# Patient Record
Sex: Male | Born: 1995 | Race: White | Hispanic: No | Marital: Single | Smoking: Never smoker
Health system: Southern US, Community
[De-identification: ages and names within clinical notes are randomized; demographics above are authoritative.]

## PROBLEM LIST (undated history)

## (undated) ENCOUNTER — Ambulatory Visit: Payer: Self-pay

## (undated) DIAGNOSIS — F419 Anxiety disorder, unspecified: Secondary | ICD-10-CM

## (undated) DIAGNOSIS — N289 Disorder of kidney and ureter, unspecified: Secondary | ICD-10-CM

## (undated) HISTORY — DX: Disorder of kidney and ureter, unspecified: N28.9

## (undated) HISTORY — PX: TONSILLECTOMY: SUR1361

---
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
## 2018-04-30 ENCOUNTER — Emergency Department: Payer: No Typology Code available for payment source

## 2018-04-30 ENCOUNTER — Emergency Department
Admission: EM | Admit: 2018-04-30 | Discharge: 2018-04-30 | Disposition: A | Discharge status: Home / Self Care | Payer: No Typology Code available for payment source | Attending: Emergency Medicine | Admitting: Emergency Medicine

## 2018-04-30 ENCOUNTER — Encounter: Payer: Self-pay | Admitting: Emergency Medicine

## 2018-04-30 ENCOUNTER — Other Ambulatory Visit: Payer: Self-pay

## 2018-04-30 DIAGNOSIS — Y9389 Activity, other specified: Secondary | ICD-10-CM | POA: Insufficient documentation

## 2018-04-30 DIAGNOSIS — S161XXA Strain of muscle, fascia and tendon at neck level, initial encounter: Secondary | ICD-10-CM | POA: Insufficient documentation

## 2018-04-30 DIAGNOSIS — Y990 Civilian activity done for income or pay: Secondary | ICD-10-CM | POA: Insufficient documentation

## 2018-04-30 DIAGNOSIS — Y929 Unspecified place or not applicable: Secondary | ICD-10-CM | POA: Insufficient documentation

## 2018-04-30 DIAGNOSIS — S0990XA Unspecified injury of head, initial encounter: Secondary | ICD-10-CM | POA: Diagnosis present
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
## 2018-04-30 NOTE — Discharge Instructions (Addendum)
Follow-up with your doctor Worker's Comp. choice if not better in 3 days.  Take Tylenol and ibuprofen as needed for pain.  He is what he across the upper back followed by ice.  If you are worsening return to the emergency department.

## 2018-04-30 NOTE — ED Notes (Signed)
Transported to CT and XR
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
## 2018-04-30 NOTE — ED Notes (Signed)
Pt verbalizes understanding of d/c instructions and follow up. 

## 2018-06-21 ENCOUNTER — Other Ambulatory Visit: Payer: Self-pay

## 2018-06-21 ENCOUNTER — Encounter: Payer: Self-pay | Admitting: Emergency Medicine

## 2018-06-21 ENCOUNTER — Ambulatory Visit
Admission: EM | Admit: 2018-06-21 | Discharge: 2018-06-21 | Disposition: A | Discharge status: Home / Self Care | Payer: Medicaid Other | Attending: Family Medicine | Admitting: Family Medicine

## 2018-06-21 DIAGNOSIS — H6123 Impacted cerumen, bilateral: Secondary | ICD-10-CM

## 2018-06-21 MED ORDER — NEOMYCIN-POLYMYXIN-HC 3.5-10000-1 OT SUSP: 4.0000 [drp] | Freq: Three times a day (TID) | OTIC | 0 refills | Status: AC

## 2018-06-21 NOTE — ED Provider Notes (Signed)
[facility] ____________________________________________  Time seen: Approximately 1:51 PM  I have reviewed the triage vital signs and the nursing notes.   HISTORY  Chief Complaint Ear Problem (left)  HPI Timothy James is a 22 y.o. male presented for evaluation of left ear.  Patient reports that today he had some light-colored waxy drainage from his left ear and also reports for the last few days has had some muffled hearing to his left ear.  Denies any ear pain.  States that he does have a issue of wax buildup in both of his ears.  Reports some recent cough, and states that his girlfriend has had a cold.  Denies sore throat or a lot of nasal congestion.  No fevers.  Denies ear trauma.  Denies other aggravating alleviating factors.  Reports otherwise feels well.   History reviewed. No pertinent past medical history. Denies   There are no active problems to display for this patient.   Past Surgical History:  Procedure Laterality Date  . TONSILLECTOMY       No current facility-administered medications for this encounter.   Current Outpatient Medications:  .  neomycin-polymyxin-hydrocortisone (CORTISPORIN) 3.5-10000-1 OTIC suspension, Place 4 drops into both ears 3 (three) times daily for 7 days., Disp: 5 mL, Rfl: 0  Allergies Patient has no known allergies.  Family History  Problem Relation Age of Onset  . Hypertension Mother   . Diabetes Father   . Other Father 27       traumatic brain injury    Social History Social History   Tobacco Use  . Smoking status: Never Smoker  . Smokeless tobacco: Never Used  Substance Use Topics  . Alcohol use: Not Currently  . Drug use: Not Currently    Review of Systems Constitutional: No fever ENT: No sore throat. As above. Cardiovascular: Denies chest pain. Respiratory: Denies shortness of breath. Gastrointestinal: No abdominal pain. Skin: Negative for  rash. .  ____________________________________________   PHYSICAL EXAM:  VITAL SIGNS: ED Triage Vitals [06/21/18 1156]  Enc Vitals Group     BP (!) 112/100     Pulse Rate 61     Resp 16     Temp 98.1 F (36.7 C)     Temp Source Oral     SpO2 100 %     Weight 150 lb (68 kg)     Height 6' (1.829 m)     Head Circumference      Peak Flow      Pain Score 0     Pain Loc      Pain Edu?      Excl. in GC?    Vitals:   06/21/18 1156 06/21/18 1158  BP: (!) 112/100 127/89  Pulse: 61   Resp: 16   Temp: 98.1 F (36.7 C)   TempSrc: Oral   SpO2: 100%   Weight: 150 lb (68 kg)   Height: 6' (1.829 m)      Constitutional: Alert and oriented. Well appearing and in no acute distress. Eyes: Conjunctivae are normal.  Head: Atraumatic. No sinus tenderness to palpation. No swelling. No erythema.  Ears: Bilateral ears nontender but with total cerumen impaction bilaterally.  Post irrigation and cerumen removal, canal is clear.  Left: Nontender, mild erythema and excoriation in canal, no TM erythema and normal-appearing TM.  Right: Nontender, minimal erythema to canal, normal-appearing TM.  No mastoid tenderness bilaterally.  Nose:No nasal congestion  Mouth/Throat: Mucous membranes are moist. No pharyngeal erythema. No tonsillar swelling  or exudate.  Neck: No stridor.  No cervical spine tenderness to palpation. Hematological/Lymphatic/Immunilogical: No cervical lymphadenopathy. Cardiovascular: Normal rate, regular rhythm. Grossly normal heart sounds.  Good peripheral circulation. Respiratory: Normal respiratory effort.  No retractions. No wheezes, rales or rhonchi. Good air movement.  Musculoskeletal: Ambulatory with steady gait.  Neurologic:  Normal speech and language. No gait instability. Skin:  Skin appears warm, dry and intact. No rash noted. Psychiatric: Mood and affect are normal. Speech and behavior are normal.  ___________________________________________   LABS (all labs ordered  are listed, but only abnormal results are displayed)  Labs Reviewed - No data to display ____________________________________________   PROCEDURES Procedures   Total cerumen impaction bilaterally.  Cerumen removal by nurse with irrigation.  Canals clear post.  Patient tolerated well.  INITIAL IMPRESSION / ASSESSMENT AND PLAN / ED COURSE  Pertinent labs & imaging results that were available during my care of the patient were reviewed by me and considered in my medical decision making (see chart for details).  Well-appearing patient.  Cerumen impaction.  Post removal reevaluated.  Patient reports improved hearing and denies pain.  Left ear canal with mild erythema excoriation.  Will supportively treat with Cortisporin.  Encourage supportive care.Discussed indication, risks and benefits of medications with patient. Discussed follow up and return parameters including no resolution or any worsening concerns. Patient verbalized understanding and agreed to plan.   ____________________________________________   FINAL CLINICAL IMPRESSION(S) / ED DIAGNOSES  Final diagnoses:  Bilateral impacted cerumen     ED Discharge Orders         Ordered    neomycin-polymyxin-hydrocortisone (CORTISPORIN) 3.5-10000-1 OTIC suspension  3 times daily     06/21/18 1258           Note: This dictation was prepared with Dragon dictation along with smaller phrase technology. Any transcriptional errors that result from this process are unintentional.         Renford Dills, NP 06/21/18 1357

## 2018-06-21 NOTE — ED Triage Notes (Signed)
Patient in today stating that he had some clear drainage from his left ear this morning. Patient denies pain or fever.

## 2018-06-21 NOTE — Discharge Instructions (Addendum)
Use medication as prescribed.  ° °Follow up with your primary care physician this week as needed. Return to Urgent care for new or worsening concerns.  ° °

## 2019-03-10 ENCOUNTER — Emergency Department: Payer: Self-pay

## 2019-03-10 ENCOUNTER — Encounter: Payer: Self-pay | Admitting: Emergency Medicine

## 2019-03-10 ENCOUNTER — Other Ambulatory Visit: Payer: Self-pay

## 2019-03-10 ENCOUNTER — Emergency Department
Admission: EM | Admit: 2019-03-10 | Discharge: 2019-03-10 | Disposition: A | Discharge status: Home / Self Care | Payer: Self-pay | Attending: Emergency Medicine | Admitting: Emergency Medicine

## 2019-03-10 DIAGNOSIS — R509 Fever, unspecified: Secondary | ICD-10-CM

## 2019-03-10 DIAGNOSIS — R5383 Other fatigue: Secondary | ICD-10-CM

## 2019-03-10 DIAGNOSIS — J069 Acute upper respiratory infection, unspecified: Secondary | ICD-10-CM | POA: Insufficient documentation

## 2019-03-10 DIAGNOSIS — R059 Cough, unspecified: Secondary | ICD-10-CM

## 2019-03-10 DIAGNOSIS — R0600 Dyspnea, unspecified: Secondary | ICD-10-CM

## 2019-03-10 DIAGNOSIS — R05 Cough: Secondary | ICD-10-CM

## 2019-03-10 DIAGNOSIS — Z20828 Contact with and (suspected) exposure to other viral communicable diseases: Secondary | ICD-10-CM | POA: Insufficient documentation

## 2019-03-10 LAB — SARS CORONAVIRUS 2 BY RT PCR (HOSPITAL ORDER, PERFORMED IN ~~LOC~~ HOSPITAL LAB): SARS Coronavirus 2: NEGATIVE

## 2019-03-10 MED ORDER — METHYLPREDNISOLONE 4 MG PO TBPK: ORAL_TABLET | ORAL | 0 refills | Status: DC

## 2019-03-10 MED ORDER — BENZONATATE 100 MG PO CAPS: 200.0000 mg | ORAL_CAPSULE | Freq: Three times a day (TID) | ORAL | 0 refills | Status: DC | PRN

## 2019-03-10 NOTE — ED Provider Notes (Signed)
[facility] Emergency Department Provider Note   ____________________________________________   First MD Initiated Contact with Patient 03/10/19 1840     (approximate)  I have reviewed the triage vital signs and the nursing notes.   HISTORY  Chief Complaint Cough and Fever    HPI Timothy James is a 23 y.o. male patient presents with fever, cough, headache, mild dyspnea, and fatigue for 2 days.  Patient state cough and shortness of breath increased this morning upon awakening.  Patient denies nausea, vomiting, or diarrhea.  Patient denies chest pain or myalgia.  Denies recent travel or known contact with  Asymptomatic or symptomatic COVID personnel.  Patient rates his headache as a 8/10.  Patient describes the headache as "throbbing".  No palliative measures for complaint.        History reviewed. No pertinent past medical history.  There are no active problems to display for this patient.   Past Surgical History:  Procedure Laterality Date  . TONSILLECTOMY      Prior to Admission medications   Medication Sig Start Date End Date Taking? Authorizing Provider  benzonatate (TESSALON PERLES) 100 MG capsule Take 2 capsules (200 mg total) by mouth 3 (three) times daily as needed. 03/10/19 03/09/20  Joni ReiningSmith, Dontray Haberland K, PA-C  methylPREDNISolone (MEDROL DOSEPAK) 4 MG TBPK tablet Take Tapered dose as directed 03/10/19   Joni ReiningSmith, Taheem Fricke K, PA-C    Allergies Patient has no known allergies.  Family History  Problem Relation Age of Onset  . Hypertension Mother   . Diabetes Father   . Other Father 2041       traumatic brain injury    Social History Social History   Tobacco Use  . Smoking status: Never Smoker  . Smokeless tobacco: Never Used  Substance Use Topics  . Alcohol use: Not Currently  . Drug use: Not Currently    Review of Systems Constitutional: Fever/chills.   Eyes: No visual changes. ENT: No sore throat. Cardiovascular: Denies chest  pain. Respiratory: Denies shortness of breath. Gastrointestinal: No abdominal pain.  No nausea, no vomiting.  No diarrhea.  No constipation. Genitourinary: Negative for dysuria. Musculoskeletal: Negative for back pain. Skin: Negative for rash. Neurological: Negative for headaches, focal weakness or numbness.  ____________________________________________   PHYSICAL EXAM:  VITAL SIGNS: ED Triage Vitals  Enc Vitals Group     BP 03/10/19 1833 119/81     Pulse Rate 03/10/19 1833 (!) 105     Resp 03/10/19 1833 18     Temp 03/10/19 1833 99.1 F (37.3 C)     Temp src --      SpO2 03/10/19 1833 98 %     Weight --      Height --      Head Circumference --      Peak Flow --      Pain Score 03/10/19 1827 8     Pain Loc --      Pain Edu? --      Excl. in GC? --    Constitutional: Alert and oriented. Well appearing and in no acute distress. Eyes: Conjunctivae are normal. PERRL. EOMI. Head: Atraumatic. Nose: No congestion/rhinnorhea. Mouth/Throat: Mucous membranes are moist.  Oropharynx non-erythematous. Neck: No stridor.  No cervical spine tenderness to palpation Hematological/Lymphatic/Immunilogical: No cervical lymphadenopathy. Cardiovascular: Normal rate, regular rhythm. Grossly normal heart sounds.  Good peripheral circulation. Respiratory: Normal respiratory effort.  No retractions. Lungs CTAB. Gastrointestinal: Soft and nontender. No distention. No abdominal bruits. No CVA tenderness. Genitourinary:  Deferred Musculoskeletal: No lower extremity tenderness nor edema.  No joint effusions. Neurologic:  Normal speech and language. No gross focal neurologic deficits are appreciated. No gait instability. Skin:  Skin is warm, dry and intact. No rash noted. Psychiatric: Mood and affect are normal. Speech and behavior are normal.  ____________________________________________   LABS (all labs ordered are listed, but only abnormal results are displayed)  Labs Reviewed  SARS  CORONAVIRUS 2 (HOSPITAL ORDER, [facility])   ____________________________________________  EKG   ____________________________________________  Elsie  ED MD interpretation:    Official radiology report(s): Dg Chest Port 1 View  Result Date: 03/10/2019 CLINICAL DATA:  Fever and cough.  Mild dyspnea. EXAM: PORTABLE CHEST 1 VIEW COMPARISON:  Chest x-ray dated 02/16/2013 FINDINGS: There is some blunting of the right costophrenic angle. The heart size is normal. There is no pneumothorax. No large area of consolidation. There are subtle airspace opacities in the mid lung zones bilaterally and symmetrically, favored to represent a benign finding. IMPRESSION: 1. Mild blunting of the right costophrenic angle which may represent a trace right-sided effusion. 2. Otherwise, no acute cardiopulmonary process. Electronically Signed   By: Constance Holster M.D.   On: 03/10/2019 19:09    ____________________________________________   PROCEDURES  Procedure(s) performed (including Critical Care):  Procedures   ____________________________________________   INITIAL IMPRESSION / ASSESSMENT AND PLAN / ED COURSE  As part of my medical decision making, I reviewed the following data within the [facility]       Patient presents with 2 days of cough, fever, headache, and weakness.  Physical exam is consistent with viral respiratory infection.  Patient given discharge care instructions and recommend today quarantine pending corona viral test results.      ____________________________________________   FINAL CLINICAL IMPRESSION(S) / ED DIAGNOSES  Final diagnoses:  Viral URI with cough     ED Discharge Orders         Ordered    benzonatate (TESSALON PERLES) 100 MG capsule  3 times daily PRN     03/10/19 1933    methylPREDNISolone (MEDROL DOSEPAK) 4 MG TBPK tablet     03/10/19 1933           Note:  This document was prepared using  Dragon voice recognition software and may include unintentional dictation errors.    Sable Feil, PA-C 03/10/19 Felicie Morn, MD 03/10/19 2036

## 2019-03-10 NOTE — Discharge Instructions (Addendum)
Advise 2-day quarantine pending results of coronavirus test.  Medication as directed.

## 2019-03-10 NOTE — ED Triage Notes (Signed)
Pt to ED via POV c/o cough, fever, and headache since waking up. Pt is in NAD

## 2019-03-23 ENCOUNTER — Other Ambulatory Visit: Payer: Self-pay

## 2019-03-23 ENCOUNTER — Emergency Department: Payer: No Typology Code available for payment source

## 2019-03-23 ENCOUNTER — Encounter: Payer: Self-pay | Admitting: Emergency Medicine

## 2019-03-23 ENCOUNTER — Emergency Department
Admission: EM | Admit: 2019-03-23 | Discharge: 2019-03-23 | Disposition: A | Discharge status: Home / Self Care | Payer: No Typology Code available for payment source | Attending: Emergency Medicine | Admitting: Emergency Medicine

## 2019-03-23 DIAGNOSIS — Y9289 Other specified places as the place of occurrence of the external cause: Secondary | ICD-10-CM | POA: Insufficient documentation

## 2019-03-23 DIAGNOSIS — X501XXA Overexertion from prolonged static or awkward postures, initial encounter: Secondary | ICD-10-CM | POA: Insufficient documentation

## 2019-03-23 DIAGNOSIS — Y9389 Activity, other specified: Secondary | ICD-10-CM | POA: Diagnosis not present

## 2019-03-23 DIAGNOSIS — Y999 Unspecified external cause status: Secondary | ICD-10-CM | POA: Insufficient documentation

## 2019-03-23 DIAGNOSIS — S99911A Unspecified injury of right ankle, initial encounter: Secondary | ICD-10-CM

## 2019-03-23 DIAGNOSIS — S99811A Other specified injuries of right ankle, initial encounter: Secondary | ICD-10-CM | POA: Diagnosis not present

## 2019-03-23 DIAGNOSIS — S99821A Other specified injuries of right foot, initial encounter: Secondary | ICD-10-CM | POA: Diagnosis present

## 2019-03-23 MED ORDER — IBUPROFEN 200 MG PO TABS: 200.0000 mg | ORAL_TABLET | Freq: Four times a day (QID) | ORAL | 0 refills | Status: DC | PRN

## 2019-03-23 NOTE — Discharge Instructions (Addendum)
It does not feel as if your Achilles tendon has ruptured but please follow-up with orthopedics for reevaluation next week.  Please use crutches and wear splint.

## 2019-03-23 NOTE — ED Provider Notes (Addendum)
[facility] Emergency Department Provider Note  ____________________________________________  Time seen: Approximately 3:37 PM  I have reviewed the triage vital signs and the nursing notes.   HISTORY  Chief Complaint Foot Pain    HPI Timothy James is a 23 y.o. male that presents to the emergency department for evaluation of right posterior heel and ankle pain.  Patient states that he was on some pallets at work yesterday and the pallet broke in his foot twisted.  He has had some difficulty bearing weight since incident.  Ankle and foot were swollen yesterday but patient iced and elevated and this has improved.  He has taken ibuprofen for pain with improvement.  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  Area feels "tight." No additional injuries.   History reviewed. No pertinent past medical history.  There are no active problems to display for this patient.   Past Surgical History:  Procedure Laterality Date  . TONSILLECTOMY      Prior to Admission medications   Medication Sig Start Date End Date Taking? Authorizing Provider  ibuprofen (MOTRIN IB) 200 MG tablet Take 1 tablet (200 mg total) by mouth every 6 (six) hours as needed. 03/23/19   Enid DerryWagner, Selyna Klahn, PA-C    Allergies Patient has no known allergies.  Family History  Problem Relation Age of Onset  . Hypertension Mother   . Diabetes Father   . Other Father [id number]       traumatic brain injury    Social History Social History   Tobacco Use  . Smoking status: Never Smoker  . Smokeless tobacco: Never Used  Substance Use Topics  . Alcohol use: Not Currently  . Drug use: Not Currently     Review of Systems  Gastrointestinal:  No nausea, no vomiting.  Musculoskeletal: Positive for foot and ankle pain. Skin: Negative for rash, abrasions, lacerations, ecchymosis. Neurological: Negative for numbness or  tingling   ____________________________________________   PHYSICAL EXAM:  VITAL SIGNS: ED Triage Vitals  Enc Vitals Group     BP 03/23/19 1313 139/78     Pulse Rate 03/23/19 1313 62     Resp --      Temp 03/23/19 1313 98.8 F (37.1 C)     Temp Source 03/23/19 1313 Oral     SpO2 03/23/19 1313 98 %     Weight 03/23/19 1314 155 lb (70.3 kg)     Height 03/23/19 1314 6\' 2"  (1.88 m)     Head Circumference --      Peak Flow --      Pain Score 03/23/19 1314 6     Pain Loc --      Pain Edu? --      Excl. in [place]? --      Constitutional: Alert and oriented. Well appearing and in no acute distress. Eyes: Conjunctivae are normal. PERRL. EOMI. Head: Atraumatic. ENT:      Ears:      Nose: No congestion/rhinnorhea.      Mouth/Throat: Mucous membranes are moist.  Neck: No stridor.   Cardiovascular: Normal rate, regular rhythm.  Good peripheral circulation. Respiratory: Normal respiratory effort without tachypnea or retractions. Lungs CTAB. Good air entry to the bases with no decreased or absent breath sounds. Musculoskeletal: Full range of motion to all extremities. No gross deformities appreciated.  Tenderness to palpation to posterior heel and ankle.  Negative Thompson test.  No visible swelling.  Full range of motion of ankle but with pain.  Antalgic gait. Neurologic:  Normal speech and language. No gross focal neurologic deficits are appreciated.  Skin:  Skin is warm, dry and intact. No rash noted. Psychiatric: Mood and affect are normal. Speech and behavior are normal. Patient exhibits appropriate insight and judgement.   ____________________________________________   LABS (all labs ordered are listed, but only abnormal results are displayed)  Labs Reviewed - No data to display ____________________________________________  EKG   ____________________________________________  RADIOLOGY Robinette Haines, personally viewed and evaluated these images (plain radiographs) as  part of my medical decision making, as well as reviewing the written report by the radiologist.  Dg Foot Complete Right  Result Date: 03/23/2019 CLINICAL DATA:  Right foot pain after injury EXAM: RIGHT FOOT COMPLETE - 3+ VIEW COMPARISON:  None. FINDINGS: There is no evidence of fracture or dislocation. There is no evidence of arthropathy or other focal bone abnormality. Soft tissues are unremarkable. IMPRESSION: No fracture or malalignment. Electronically Signed   By: Ilona Sorrel M.D.   On: 03/23/2019 13:47    ____________________________________________    PROCEDURES  Procedure(s) performed:    Procedures    Medications - No data to display   ____________________________________________   INITIAL IMPRESSION / ASSESSMENT AND PLAN / ED COURSE  Pertinent labs & imaging results that were available during my care of the patient were reviewed by me and considered in my medical decision making (see chart for details).  Review of the  [place]RS was performed in accordance of the [place] prior to dispensing any controlled drugs.   Patient presents emergency department for evaluation of foot and ankle pain after injury yesterday.  Vital signs and exam are reassuring.  Foot x-ray negative for acute bony abnormalities.  Patient declines ankle x-ray because his ride is waiting and he needs to leave.  Posterior splint was placed.  No indication of Achilles rupture.  Patient declines IM Toradol for pain.  He states that ibuprofen has been working.  Patient will be discharged home with prescriptions for Motrin. Patient is to follow up with primary care and orthopedics as directed.  Referral was given to Dr. Roland Rack. Patient is given ED precautions to return to the ED for any worsening or new symptoms.  Timothy James was evaluated in Emergency Department on 03/23/2019 for the symptoms described in the history of present illness. He was evaluated in the context of the global COVID-19 pandemic, which  necessitated consideration that the patient might be at risk for infection with the SARS-CoV-2 virus that causes COVID-19. Institutional protocols and algorithms that pertain to the evaluation of patients at risk for COVID-19 are in a state of rapid change based on information released by regulatory bodies including the CDC and federal and state organizations. These policies and algorithms were followed during the patient's care in the ED.     ____________________________________________  FINAL CLINICAL IMPRESSION(S) / ED DIAGNOSES  Final diagnoses:  Injury of right ankle, initial encounter      NEW MEDICATIONS STARTED DURING THIS VISIT:  ED Discharge Orders         Ordered    ibuprofen (MOTRIN IB) 200 MG tablet  Every 6 hours PRN     03/23/19 1513              This chart was dictated using voice recognition software/Dragon. Despite best efforts to proofread, errors can occur which can change the meaning. Any change was purely unintentional.  Enid DerryWagner, Lille Karim, PA-C 03/23/19 1542    Enid DerryWagner, Sutton Hirsch, PA-C 03/23/19 1543    Sharyn CreamerQuale, Mark, MD 03/23/19 [phone]

## 2019-03-23 NOTE — ED Triage Notes (Signed)
Presents from work with right foot/heel pain  States he was on a pallet at work and the pallet broke  Having pain to right heel area  States he has a "short achilles" on the right

## 2019-04-09 ENCOUNTER — Emergency Department: Payer: No Typology Code available for payment source

## 2019-04-09 ENCOUNTER — Encounter: Payer: Self-pay | Admitting: Emergency Medicine

## 2019-04-09 ENCOUNTER — Emergency Department
Admission: EM | Admit: 2019-04-09 | Discharge: 2019-04-09 | Disposition: A | Discharge status: Home / Self Care | Payer: No Typology Code available for payment source | Attending: Emergency Medicine | Admitting: Emergency Medicine

## 2019-04-09 ENCOUNTER — Other Ambulatory Visit: Payer: Self-pay

## 2019-04-09 DIAGNOSIS — W010XXD Fall on same level from slipping, tripping and stumbling without subsequent striking against object, subsequent encounter: Secondary | ICD-10-CM | POA: Diagnosis not present

## 2019-04-09 DIAGNOSIS — S93491D Sprain of other ligament of right ankle, subsequent encounter: Secondary | ICD-10-CM | POA: Diagnosis not present

## 2019-04-09 DIAGNOSIS — Z4801 Encounter for change or removal of surgical wound dressing: Secondary | ICD-10-CM | POA: Diagnosis present

## 2019-04-09 MED ORDER — IBUPROFEN 600 MG PO TABS
600.0000 mg | ORAL_TABLET | Freq: Once | ORAL | Status: AC
  Administered 2019-04-09: 15:00:00 600 mg via ORAL
  Filled 2019-04-09: qty 1

## 2019-04-09 NOTE — ED Triage Notes (Signed)
States want to have splint rechecked / replaced.  States was seen through ED 2 weeks ago for injury.  Reports has not scheduled Ortho follow up yet "due to workmen's comp'.

## 2019-04-09 NOTE — Discharge Instructions (Signed)
Please follow up with orthopedics as soon as possible.  Take Ibuprofen 600-800mg  every 6 hours if needed for pain.

## 2019-04-09 NOTE — ED Provider Notes (Signed)
[facility] Emergency Department Provider Note ____________________________________________  Time seen: Approximately 1:15 PM  I have reviewed the triage vital signs and the nursing notes.   HISTORY  Chief Complaint Cast Check    HPI Timothy James is a 23 y.o. male who presents to the emergency department for evaluation and treatment of foot and ankle pain. He injured it 2 weeks ago and was evaluated here. He was placed in a posterior splint and given crutches. He slipped in the shower about a week ago and pain worsened. He has been unable to see ortho due to the workman's comp person at [employer] is out of town.   History reviewed. No pertinent past medical history.  There are no active problems to display for this patient.   Past Surgical History:  Procedure Laterality Date  . TONSILLECTOMY      Prior to Admission medications   Medication Sig Start Date End Date Taking? Authorizing Provider  ibuprofen (MOTRIN IB) 200 MG tablet Take 1 tablet (200 mg total) by mouth every 6 (six) hours as needed. 03/23/19   Laban Emperor, PA-C    Allergies Patient has no known allergies.  Family History  Problem Relation Age of Onset  . Hypertension Mother   . Diabetes Father   . Other Father 42       traumatic brain injury    Social History Social History   Tobacco Use  . Smoking status: Never Smoker  . Smokeless tobacco: Never Used  Substance Use Topics  . Alcohol use: Not Currently  . Drug use: Not Currently    Review of Systems Constitutional: Negative for fever. Cardiovascular: Negative for chest pain. Respiratory: Negative for shortness of breath. Musculoskeletal: Positive for right foot and ankle pain. Skin: Negative for open wound.  Neurological: Negative for decrease in sensation  ____________________________________________   PHYSICAL EXAM:  VITAL SIGNS: ED Triage Vitals  Enc Vitals Group     BP 04/09/19 1242 126/80     Pulse  Rate 04/09/19 1242 68     Resp 04/09/19 1242 16     Temp 04/09/19 1242 98.1 F (36.7 C)     Temp Source 04/09/19 1242 Oral     SpO2 04/09/19 1242 100 %     Weight 04/09/19 1237 154 lb 15.7 oz (70.3 kg)     Height --      Head Circumference --      Peak Flow --      Pain Score 04/09/19 1237 0     Pain Loc --      Pain Edu? --      Excl. in [place]? --     Constitutional: Alert and oriented. Well appearing and in no acute distress. Eyes: Conjunctivae are clear without discharge or drainage Head: Atraumatic Neck: Supple Respiratory: No cough. Respirations are even and unlabored. Musculoskeletal: ATFL pattern swelling and tenderness. Thompson's test negative. No tenderness over the proximal fibula. Neurologic: Motor and sensory function of toes is intact.  Skin: Blanchable on RLE.  Psychiatric: Affect and behavior are appropriate.  ____________________________________________   LABS (all labs ordered are listed, but only abnormal results are displayed)  Labs Reviewed - No data to display ____________________________________________  RADIOLOGY  Right ankle negative for acute bony abnormality. ____________________________________________   PROCEDURES  .Splint Application  Date/Time: 04/09/2019 2:17 PM Performed by: Victorino Dike, FNP Authorized by: Victorino Dike, FNP   Consent:    Consent obtained:  Verbal   Consent given by:  Patient   Risks discussed:  Pain Pre-procedure details:    Sensation:  Normal Procedure details:    Laterality:  Right   Supplies:  Prefabricated splint Post-procedure details:    Pain:  Unchanged   Sensation:  Normal   Patient tolerance of procedure:  Tolerated well, no immediate complications    ____________________________________________   INITIAL IMPRESSION / ASSESSMENT AND PLAN / ED COURSE  Timothy James is a 23 y.o. who presents to the emergency department for a second evaluation of right ankle pain. Patient states that  he has taken ibuprofen with some relief. He would like the splint changed because it has become very uncomfortable.   Due to the amount of swelling, we will x-ray the ankle today. Foot was imaged at his last visit, but not the ankle.   X-ray of the ankle is negative for acute findings. Velcro stirrup splint applied. He is to continue using his crutches and follow up with orthopedics when possible.  Pain medication was offered, but patient chooses to continue the ibuprofen and tylenol in alternation. He is to return to the ER for symptoms that change or worsen if unable to schedule an appointment.  Medications  ibuprofen (ADVIL) tablet 600 mg (600 mg Oral Given 04/09/19 1438)    Pertinent labs & imaging results that were available during my care of the patient were reviewed by me and considered in my medical decision making (see chart for details).  _________________________________________   FINAL CLINICAL IMPRESSION(S) / ED DIAGNOSES  Final diagnoses:  Sprain of other ligament of right ankle, subsequent encounter    ED Discharge Orders    None       If controlled substance prescribed during this visit, 12 month history viewed on the [place] prior to issuing an initial prescription for Schedule II or III opiod.   Chinita Pesterriplett, Maddyson Keil B, FNP 04/09/19 1503    Sharman CheekStafford, Phillip, MD 04/11/19 1557

## 2019-04-09 NOTE — ED Notes (Signed)
See triage note  States he was seen couple of weeks ago for ankle/foot injury   States he has not been able ot see ortho MD d/t a W/C injury

## 2019-04-09 NOTE — ED Notes (Signed)
Ankle splint applied to R ankle. Pt educated on appropriate wear and splint care. Pt verbalized understanding and demonstrated skills.

## 2020-04-20 IMAGING — DX RIGHT FOOT COMPLETE - 3+ VIEW
3 series · 3 of 3 positions shown · non-contrast
Comparison: None.

CLINICAL DATA: Right foot pain after injury

EXAM:
RIGHT FOOT COMPLETE - 3+ VIEW

[foot ap]
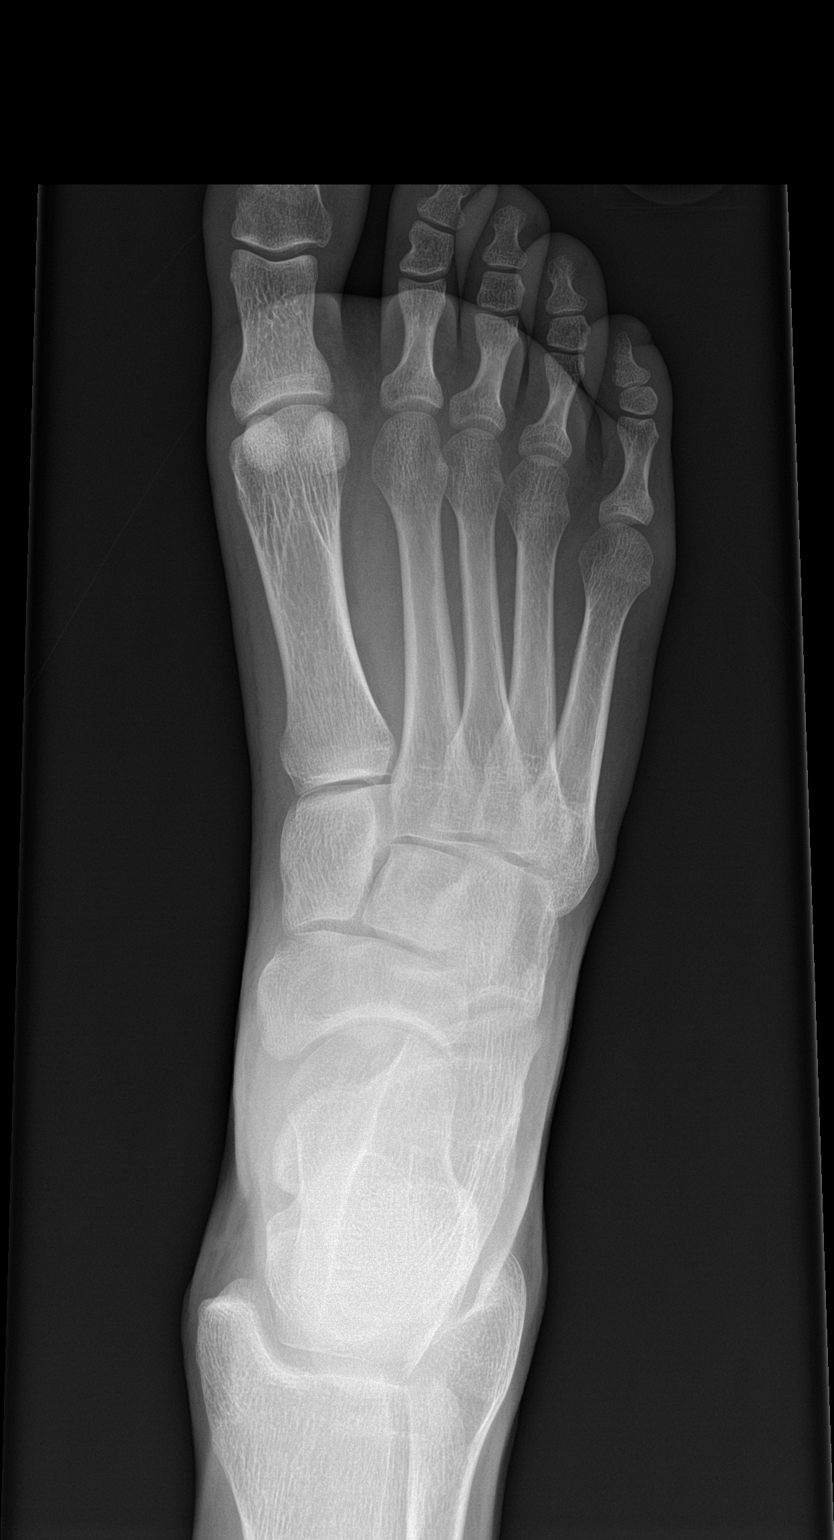

[foot obl]
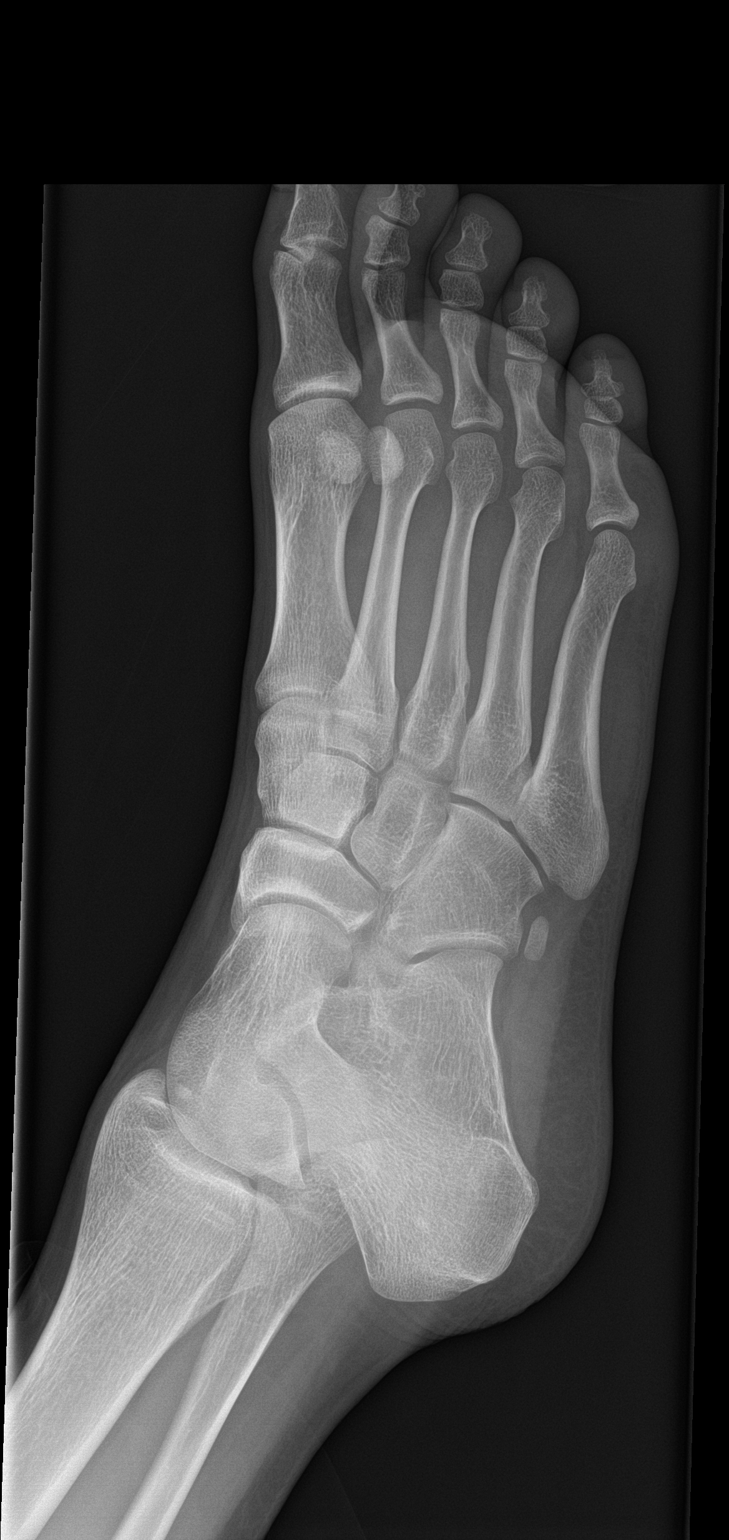

[foot lat]
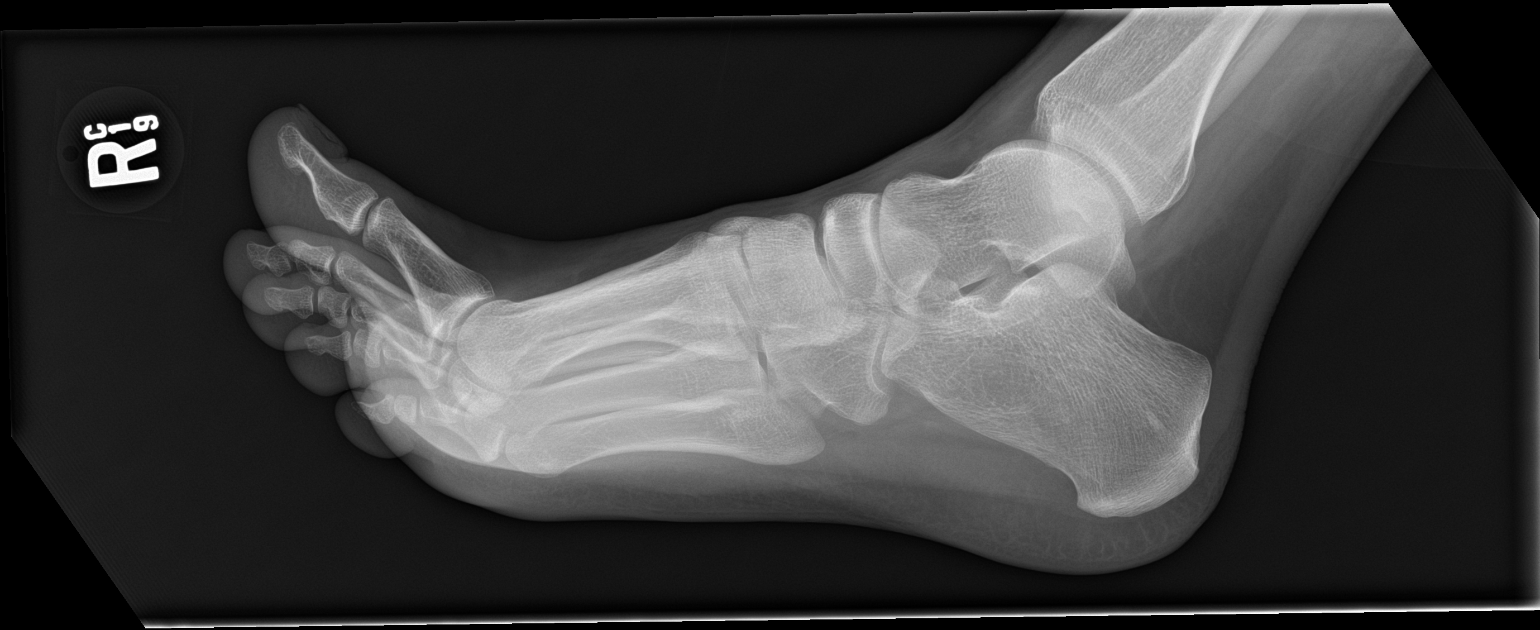

[3 of 3 positions shown; findings below may reference images not displayed]

FINDINGS: There is no evidence of fracture or dislocation. There is no
evidence of arthropathy or other focal bone abnormality. Soft
tissues are unremarkable.
IMPRESSION: No fracture or malalignment.

## 2021-07-27 ENCOUNTER — Other Ambulatory Visit: Payer: Self-pay

## 2021-07-27 ENCOUNTER — Emergency Department
Admission: EM | Admit: 2021-07-27 | Discharge: 2021-07-27 | Disposition: A | Discharge status: Home / Self Care | Payer: Self-pay | Attending: Emergency Medicine | Admitting: Emergency Medicine

## 2021-07-27 DIAGNOSIS — Z5321 Procedure and treatment not carried out due to patient leaving prior to being seen by health care provider: Secondary | ICD-10-CM | POA: Insufficient documentation

## 2021-07-27 DIAGNOSIS — H9202 Otalgia, left ear: Secondary | ICD-10-CM | POA: Insufficient documentation

## 2021-07-27 NOTE — ED Triage Notes (Signed)
Pt in with co left earache since Friday.

## 2021-07-28 ENCOUNTER — Other Ambulatory Visit: Payer: Self-pay

## 2021-07-28 ENCOUNTER — Ambulatory Visit
Admission: EM | Admit: 2021-07-28 | Discharge: 2021-07-28 | Disposition: A | Discharge status: Home / Self Care | Payer: Self-pay

## 2021-07-28 DIAGNOSIS — H6123 Impacted cerumen, bilateral: Secondary | ICD-10-CM

## 2021-07-28 NOTE — ED Provider Notes (Signed)
[facility]    CSN: [id number] Arrival date & time: 07/28/21  1042      History   Chief Complaint Chief Complaint  Patient presents with   Otalgia    HPI Timothy James is a 25 y.o. male.   Patient presents with decreased hearing and ear pain on the left side for 3 days.  Denies itching, drainage, fever, chills, URI symptoms.  endorses that he cleans his ears with Q-tips but does not go deep and over-the-counter eardrops.  Attempted to remove wax with peroxide yesterday but was unsuccessful.  No pertinent medical history.   History reviewed. No pertinent past medical history.  There are no problems to display for this patient.   Past Surgical History:  Procedure Laterality Date   TONSILLECTOMY         Home Medications    Prior to Admission medications   Medication Sig Start Date End Date Taking? Authorizing Provider  ibuprofen (MOTRIN IB) 200 MG tablet Take 1 tablet (200 mg total) by mouth every 6 (six) hours as needed. 03/23/19   Enid Derry, PA-C    Family History Family History  Problem Relation Age of Onset   Hypertension Mother    Diabetes Father    Other Father 84       traumatic brain injury    Social History Social History   Tobacco Use   Smoking status: Never   Smokeless tobacco: Never  Vaping Use   Vaping Use: Never used  Substance Use Topics   Alcohol use: Not Currently   Drug use: Not Currently     Allergies   Patient has no known allergies.   Review of Systems Review of Systems  Constitutional: Negative.   HENT:  Positive for ear pain. Negative for congestion, dental problem, drooling, ear discharge, facial swelling, hearing loss, mouth sores, nosebleeds, postnasal drip, rhinorrhea, sinus pressure, sinus pain, sneezing, sore throat, tinnitus, trouble swallowing and voice change.   Respiratory: Negative.    Cardiovascular: Negative.   Genitourinary: Negative.   Skin: Negative.   Neurological: Negative.      Physical Exam Triage Vital Signs ED Triage Vitals  Enc Vitals Group     BP 07/28/21 1233 (!) 147/84     Pulse Rate 07/28/21 1233 89     Resp 07/28/21 1233 16     Temp 07/28/21 1233 98.1 F (36.7 C)     Temp Source 07/28/21 1233 Oral     SpO2 07/28/21 1233 98 %     Weight 07/28/21 1235 175 lb (79.4 kg)     Height 07/28/21 1235 6' (1.829 m)     Head Circumference --      Peak Flow --      Pain Score 07/28/21 1235 4     Pain Loc --      Pain Edu? --      Excl. in [place]? --    No data found.  Updated Vital Signs BP (!) 147/84 (BP Location: Right Arm)   Pulse 89   Temp 98.1 F (36.7 C) (Oral)   Resp 16   Ht 6' (1.829 m)   Wt 175 lb (79.4 kg)   SpO2 98%   BMI 23.73 kg/m   Visual Acuity Right Eye Distance:   Left Eye Distance:   Bilateral Distance:    Right Eye Near:   Left Eye Near:    Bilateral Near:     Physical Exam Constitutional:      Appearance: Normal appearance.  He is normal weight.  HENT:     Head: Normocephalic.     Right Ear: There is impacted cerumen.     Left Ear: There is impacted cerumen.  Eyes:     Extraocular Movements: Extraocular movements intact.  Pulmonary:     Effort: Pulmonary effort is normal.  Skin:    General: Skin is warm and dry.  Neurological:     Mental Status: He is alert and oriented to person, place, and time. Mental status is at baseline.  Psychiatric:        Mood and Affect: Mood normal.        Behavior: Behavior normal.     UC Treatments / Results  Labs (all labs ordered are listed, but only abnormal results are displayed) Labs Reviewed - No data to display  EKG   Radiology No results found.  Procedures Procedures (including critical care time)  Medications Ordered in UC Medications - No data to display  Initial Impression / Assessment and Plan / UC Course  I have reviewed the triage vital signs and the nursing notes.  Pertinent labs & imaging results that were available during my care of the patient  were reviewed by me and considered in my medical decision making (see chart for details).  Bilateral impacted cerumen  Bilateral ear washing with irrigation, able to visualize tympanic membrane after lavage, no signs of infection present, recommended moving forward discontinuation of Q-tips and using only eardrops with cottonball for cleaning, may follow-up with urgent care as needed Final Clinical Impressions(s) / UC Diagnoses   Final diagnoses:  Bilateral impacted cerumen     Discharge Instructions      Moving forward try to stop using Q-tips altogether as it is still pushing your wax down into your ear even if you are not going deep  Continued use of earwax drops may use 2-3 times a week to help reduce wax in the ears, may use cotton ball to remove wax after use of drops  May follow-up at urgent care as needed   ED Prescriptions   None    PDMP not reviewed this encounter.   Valinda Hoar, NP 07/28/21 1249

## 2021-07-28 NOTE — Discharge Instructions (Signed)
Moving forward try to stop using Q-tips altogether as it is still pushing your wax down into your ear even if you are not going deep  Continued use of earwax drops may use 2-3 times a week to help reduce wax in the ears, may use cotton ball to remove wax after use of drops  May follow-up at urgent care as needed

## 2021-07-28 NOTE — ED Triage Notes (Signed)
Pt with left ear pain and muffled all weekend. Used peroxide yesterday.

## 2021-10-29 ENCOUNTER — Other Ambulatory Visit: Payer: Self-pay

## 2021-10-29 ENCOUNTER — Emergency Department: Payer: Self-pay

## 2021-10-29 DIAGNOSIS — D72829 Elevated white blood cell count, unspecified: Secondary | ICD-10-CM | POA: Insufficient documentation

## 2021-10-29 DIAGNOSIS — R1012 Left upper quadrant pain: Secondary | ICD-10-CM | POA: Insufficient documentation

## 2021-10-29 DIAGNOSIS — R000 Tachycardia, unspecified: Secondary | ICD-10-CM | POA: Insufficient documentation

## 2021-10-29 DIAGNOSIS — Z8616 Personal history of COVID-19: Secondary | ICD-10-CM | POA: Insufficient documentation

## 2021-10-29 DIAGNOSIS — R0789 Other chest pain: Secondary | ICD-10-CM | POA: Insufficient documentation

## 2021-10-29 LAB — TROPONIN I (HIGH SENSITIVITY): Troponin I (High Sensitivity): 3 ng/L (ref <18)

## 2021-10-29 LAB — BASIC METABOLIC PANEL
Anion gap: 6 (ref 5–15)
BUN: 12 mg/dL (ref 6–20)
CO2: 26 mmol/L (ref 22–32)
Calcium: 9.6 mg/dL (ref 8.9–10.3)
Chloride: 106 mmol/L (ref 98–111)
Creatinine, Ser: 0.98 mg/dL (ref 0.61–1.24)
GFR, Estimated: >60 mL/min (ref >60)
Glucose, Bld: 105 mg/dL — ABNORMAL HIGH (ref 70–99)
Potassium: 4.6 mmol/L (ref 3.5–5.1)
Sodium: 138 mmol/L (ref 135–145)

## 2021-10-29 LAB — CBC
HCT: 47.4 % (ref 39.0–52.0)
Hemoglobin: 15.8 g/dL (ref 13.0–17.0)
MCH: 28.7 pg (ref 26.0–34.0)
MCHC: 33.3 g/dL (ref 30.0–36.0)
MCV: 86.2 fL (ref 80.0–100.0)
Platelets: 345 10*3/uL (ref 150–400)
RBC: 5.5 MIL/uL (ref 4.22–5.81)
RDW: 11.9 % (ref 11.5–15.5)
WBC: 11.3 10*3/uL — ABNORMAL HIGH (ref 4.0–10.5)
nRBC: 0 % (ref 0.0–0.2)

## 2021-10-29 NOTE — ED Triage Notes (Signed)
Pt states he has had left sided rib pain that radiates up through his chest into his left neck for the past week. This pain has progressively worsened the last few days.

## 2021-10-30 ENCOUNTER — Emergency Department
Admission: EM | Admit: 2021-10-30 | Discharge: 2021-10-30 | Disposition: A | Discharge status: Home / Self Care | Payer: Self-pay | Attending: Emergency Medicine | Admitting: Emergency Medicine

## 2021-10-30 ENCOUNTER — Emergency Department: Payer: Self-pay

## 2021-10-30 DIAGNOSIS — R0789 Other chest pain: Secondary | ICD-10-CM

## 2021-10-30 DIAGNOSIS — R1012 Left upper quadrant pain: Secondary | ICD-10-CM

## 2021-10-30 LAB — HEPATIC FUNCTION PANEL
ALT: 21 U/L (ref 0–44)
AST: 21 U/L (ref 15–41)
Albumin: 4.2 g/dL (ref 3.5–5.0)
Alkaline Phosphatase: 82 U/L (ref 38–126)
Bilirubin, Direct: 0.1 mg/dL (ref 0.0–0.2)
Indirect Bilirubin: 0.8 mg/dL (ref 0.3–0.9)
Total Bilirubin: 0.9 mg/dL (ref 0.3–1.2)
Total Protein: 7.4 g/dL (ref 6.5–8.1)

## 2021-10-30 LAB — D-DIMER, QUANTITATIVE: D-Dimer, Quant: 0.48 ug/mL-FEU (ref 0.00–0.50)

## 2021-10-30 LAB — LIPASE, BLOOD: Lipase: 29 U/L (ref 11–51)

## 2021-10-30 LAB — TROPONIN I (HIGH SENSITIVITY): Troponin I (High Sensitivity): 3 ng/L (ref <18)

## 2021-10-30 MED ORDER — IOHEXOL 300 MG/ML  SOLN
100.0000 mL | Freq: Once | INTRAMUSCULAR | Status: AC | PRN
  Administered 2021-10-30: 100 mL via INTRAVENOUS
  Filled 2021-10-30: qty 100

## 2021-10-30 MED ORDER — PANTOPRAZOLE SODIUM 40 MG PO TBEC: 40.0000 mg | DELAYED_RELEASE_TABLET | Freq: Every day | ORAL | 1 refills | Status: DC

## 2021-10-30 NOTE — ED Notes (Signed)
ED Provider at bedside. 

## 2021-10-30 NOTE — ED Provider Notes (Signed)
[facility] Provider Note    Event Date/Time   First MD Initiated Contact with Patient 10/30/21 0011     (approximate)   History   Chest Pain   HPI  Timothy James is a 26 y.o. male no significant past medical history who presents for evaluation of chest pain and abdominal pain.  Patient reports that he has had left upper dull pain sometimes sharp has been intermittent for the last week.  Usually the pain resolves when he burps.  Today he started having pain on the left upper part of his abdomen which started to radiate up into his chest.  He reports the pain lasted about 3 hours but has now resolved.  He denies nausea or vomiting, shortness of breath, fever or chills.  No prior history of PE or DVT, no recent travel immobilization, no leg pain or swelling, no hemoptysis or exogenous hormones.  Patient did have COVID last week.  He reports that he has no chest pain or shortness of breath at this time.     No past medical history on file.  Past Surgical History:  Procedure Laterality Date   TONSILLECTOMY       Physical Exam   Triage Vital Signs: ED Triage Vitals  Enc Vitals Group     BP 10/29/21 2029 (!) 139/104     Pulse Rate 10/29/21 2029 (!) 106     Resp 10/29/21 2029 18     Temp 10/29/21 2029 98.4 F (36.9 C)     Temp Source 10/29/21 2029 Oral     SpO2 10/29/21 2029 98 %     Weight 10/29/21 2028 170 lb (77.1 kg)     Height 10/29/21 2031 6' (1.829 m)     Head Circumference --      Peak Flow --      Pain Score 10/29/21 2028 7     Pain Loc --      Pain Edu? --      Excl. in [place]? --     Most recent vital signs: Vitals:   10/30/21 0035 10/30/21 0255  BP: (!) 127/97 132/85  Pulse: (!) 102 95  Resp: 17 18  Temp: 98.2 F (36.8 C)   SpO2: 97% 100%     Constitutional: Alert and oriented. Well appearing and in no apparent distress. HEENT:      Head: Normocephalic and atraumatic.         Eyes: Conjunctivae are normal. Sclera is  non-icteric.       Mouth/Throat: Mucous membranes are moist.       Neck: Supple with no signs of meningismus. Cardiovascular: Tachycardic with regular rhythm Respiratory: Normal respiratory effort. Lungs are clear to auscultation bilaterally.  Gastrointestinal: Soft, tender to palpation over the LUQ, and non distended with positive bowel sounds. No rebound or guarding. Genitourinary: No CVA tenderness. Musculoskeletal:  No edema, cyanosis, or erythema of extremities. Neurologic: Normal speech and language. Face is symmetric. Moving all extremities. No gross focal neurologic deficits are appreciated. Skin: Skin is warm, dry and intact. No rash noted. Psychiatric: Mood and affect are normal. Speech and behavior are normal.  ED Results / Procedures / Treatments   Labs (all labs ordered are listed, but only abnormal results are displayed) Labs Reviewed  BASIC METABOLIC PANEL - Abnormal; Notable for the following components:      Result Value   Glucose, Bld 105 (*)    All other components within normal limits  CBC - Abnormal;  Notable for the following components:   WBC 11.3 (*)    All other components within normal limits  D-DIMER, QUANTITATIVE  LIPASE, BLOOD  HEPATIC FUNCTION PANEL  TROPONIN I (HIGH SENSITIVITY)  TROPONIN I (HIGH SENSITIVITY)     EKG  ED ECG REPORT I, Rudene Re, the attending physician, personally viewed and interpreted this ECG.  Sinus tachycardia with a rate of 104, normal intervals, normal axis, no ST elevations or depressions  RADIOLOGY I, Rudene Re, attending MD, have personally viewed and interpreted the images obtained during this visit as below:  CXR with no infiltrates or pneumothorax  CT abdomen pelvis with no acute finding   ___________________________________________________ Interpretation by Radiologist:  DG Chest 2 View  Result Date: 10/29/2021 CLINICAL DATA:  Chest pain. Additional history provided: Patient reports  left-sided rib pain which radiates into chest and left neck. EXAM: CHEST - 2 VIEW COMPARISON:  Prior chest radiographs 03/10/2019 and earlier. FINDINGS: Heart size within normal limits. No appreciable airspace consolidation. No evidence of pleural effusion or pneumothorax. No acute bony abnormality identified. IMPRESSION: No evidence of acute cardiopulmonary abnormality. Electronically Signed   By: Kellie Simmering D.O.   On: 10/29/2021 20:57   CT ABDOMEN PELVIS W CONTRAST  Result Date: 10/30/2021 CLINICAL DATA:  Left upper quadrant pain, left-sided rib pain that radiates through his chest into his left neck EXAM: CT ABDOMEN AND PELVIS WITH CONTRAST TECHNIQUE: Multidetector CT imaging of the abdomen and pelvis was performed using the standard protocol following bolus administration of intravenous contrast. RADIATION DOSE REDUCTION: This exam was performed according to the departmental dose-optimization program which includes automated exposure control, adjustment of the mA and/or kV according to patient size and/or use of iterative reconstruction technique. CONTRAST:  146mL OMNIPAQUE IOHEXOL 300 MG/ML  SOLN COMPARISON:  None. FINDINGS: Lower chest: Focal pulmonary opacity or pleural effusion. No pericardial effusion. Hepatobiliary: No focal liver abnormality is seen. No gallstones, gallbladder wall thickening, or biliary dilatation. Pancreas: Unremarkable. No pancreatic ductal dilatation or surrounding inflammatory changes. Spleen: Normal in size without focal abnormality.  Two splenules. Adrenals/Urinary Tract: Adrenal glands are unremarkable. Kidneys are normal, without renal calculi, focal lesion, or hydronephrosis. Bladder is unremarkable. Stomach/Bowel: Stomach is within normal limits. Appendix appears normal. No evidence of bowel wall thickening, distention, or inflammatory changes. Vascular/Lymphatic: No significant vascular findings are present. No enlarged abdominal or pelvic lymph nodes. Reproductive:  Prostate is unremarkable. Other: No abdominal wall hernia or abnormality. No abdominopelvic ascites. Musculoskeletal: Mild S shaped curvature of the thoracolumbar spine. Straightening of the normal lumbar lordosis. No acute osseous abnormality. IMPRESSION: No acute process in the abdomen. No etiology is seen for the patient's left upper quadrant pain Electronically Signed   By: Merilyn Baba M.D.   On: 10/30/2021 02:36      PROCEDURES:  Critical Care performed: No  Procedures    IMPRESSION / MDM / ASSESSMENT AND PLAN / ED COURSE  I reviewed the triage vital signs and the nursing notes.  26 y.o. male no significant past medical history who presents for evaluation of chest pain and abdominal pain.  Patient with 1 week of intermittent left upper sharp and sometimes dull chest pain that resolves with burping.  No chest pain at this time.  This evening patient had a dull left upper quadrant abdominal pain that lasted 3 hours but that has also resolved.  On exam he is well-appearing in no distress.  He slightly tachycardic with a pulse of 104 but does look a little anxious  on exam.  In spite of denying any abdominal pain he is tender to palpation on the left upper quadrant with no rebound or guarding.  Ddx: #CP: gas, gerd, MSK, pleurisy, PE especially with recent covid and slight tachycardia  #Abd pain: PUD, GERD, diverticulitis, pancreatitis, GB pathology   Plan: CBC, BMP, LFT, lipase, EKG, trop x 2, d-dimer, CT a/p   MEDICATIONS GIVEN IN ED: Medications  iohexol (OMNIPAQUE) 300 MG/ML solution 100 mL (100 mLs Intravenous Contrast Given 10/30/21 0222)     ED COURSE: Normal LFTs and lipase.  EKG showing sinus tachycardia with no other abnormalities.  2 high-sensitivity troponins were negative.  D-dimer negative.  Normal electrolytes, normal creatinine, mildly elevated white count which is nonspecific.  Chest x-ray with no infiltrates or no other abnormalities.  CT abdomen pelvis is negative.   Patient remained asymptomatic in the emergency room.  Initially tender to palpation on exam but that resolved after patient was told that his work-up was negative.  He does look slightly anxious which I think is contributing to his mild tachycardia.  We will put patient on Protonix in case his symptoms are caused by GERD.  Recommended follow-up with primary care doctor and discussed my standard return precautions with patient and his mother who was at bedside.  Admission was considered but felt unnecessary with normal work-up and resolution of his symptoms.   Consults: None   EMR reviewed including patient's last visit in to a primary care doctor which was his podiatrist for Achilles tendinitis in 2020    FINAL CLINICAL IMPRESSION(S) / ED DIAGNOSES   Final diagnoses:  Atypical chest pain  Left upper quadrant abdominal pain     Rx / DC Orders   ED Discharge Orders          Ordered    pantoprazole (PROTONIX) 40 MG tablet  Daily        10/30/21 0258             Note:  This document was prepared using Dragon voice recognition software and may include unintentional dictation errors.   Please note:  Patient was evaluated in Emergency Department today for the symptoms described in the history of present illness. Patient was evaluated in the context of the global COVID-19 pandemic, which necessitated consideration that the patient might be at risk for infection with the SARS-CoV-2 virus that causes COVID-19. Institutional protocols and algorithms that pertain to the evaluation of patients at risk for COVID-19 are in a state of rapid change based on information released by regulatory bodies including the CDC and federal and state organizations. These policies and algorithms were followed during the patient's care in the ED.  Some ED evaluations and interventions may be delayed as a result of limited staffing during the pandemic.       Alfred Levins, [place], MD 10/30/21 [phone]

## 2022-02-11 ENCOUNTER — Ambulatory Visit
Admission: EM | Admit: 2022-02-11 | Discharge: 2022-02-11 | Disposition: A | Discharge status: Home / Self Care | Payer: Self-pay | Attending: Emergency Medicine | Admitting: Emergency Medicine

## 2022-02-11 ENCOUNTER — Other Ambulatory Visit: Payer: Self-pay

## 2022-02-11 DIAGNOSIS — J029 Acute pharyngitis, unspecified: Secondary | ICD-10-CM

## 2022-02-11 DIAGNOSIS — J321 Chronic frontal sinusitis: Secondary | ICD-10-CM

## 2022-02-11 DIAGNOSIS — J020 Streptococcal pharyngitis: Secondary | ICD-10-CM

## 2022-02-11 LAB — GROUP A STREP BY PCR: Group A Strep by PCR: NOT DETECTED

## 2022-02-11 MED ORDER — AZITHROMYCIN 250 MG PO TABS: 250.0000 mg | ORAL_TABLET | Freq: Every day | ORAL | 0 refills | Status: DC

## 2022-02-11 MED ORDER — LEVOCETIRIZINE DIHYDROCHLORIDE 5 MG PO TABS: 5.0000 mg | ORAL_TABLET | Freq: Every evening | ORAL | 0 refills | Status: DC

## 2022-02-11 MED ORDER — FLUTICASONE PROPIONATE 50 MCG/ACT NA SUSP: 1.0000 | Freq: Every day | NASAL | 2 refills | Status: DC

## 2022-02-11 NOTE — ED Provider Notes (Signed)
[facility]    CSN: [id number] Arrival date & time: 02/11/22  1020      History   Chief Complaint Chief Complaint  Patient presents with   Cough   Sore Throat    HPI Timothy James is a 26 y.o. male.   Patient presents today with sore throat cough congestion nasal pressure for the past 5 days.  Patient denies any known fevers does have pain to chest only with coughing.  Has taken over-the-counter cough and cold medicine with throat lozenges with minimal relief.  In the past has had some allergies to grass.  Does not take a daily allergy medicine.  Denies any shortness of breath no nausea vomiting or diarrhea   History reviewed. No pertinent past medical history.  Patient Active Problem List   Diagnosis Date Noted   Anxiety 02/28/2013   Palpitations 02/28/2013    Past Surgical History:  Procedure Laterality Date   TONSILLECTOMY         Home Medications    Prior to Admission medications   Medication Sig Start Date End Date Taking? Authorizing Provider  azithromycin (ZITHROMAX Z-PAK) 250 MG tablet Take 1 tablet (250 mg total) by mouth daily. 02/11/22  Yes Coralyn Mark, NP  fluticasone (FLONASE) 50 MCG/ACT nasal spray Place 1 spray into both nostrils daily. 02/11/22  Yes Coralyn Mark, NP  levocetirizine (XYZAL) 5 MG tablet Take 1 tablet (5 mg total) by mouth every evening. 02/11/22  Yes Coralyn Mark, NP  ibuprofen (MOTRIN IB) 200 MG tablet Take 1 tablet (200 mg total) by mouth every 6 (six) hours as needed. 03/23/19   Enid Derry, PA-C  pantoprazole (PROTONIX) 40 MG tablet Take 1 tablet (40 mg total) by mouth daily. 10/30/21 10/30/22  Nita Sickle, MD    Family History Family History  Problem Relation Age of Onset   Hypertension Mother    Diabetes Father    Other Father 11       traumatic brain injury    Social History Social History   Tobacco Use   Smoking status: Never   Smokeless tobacco: Never  Vaping Use   Vaping  Use: Never used  Substance Use Topics   Alcohol use: Not Currently   Drug use: Not Currently     Allergies   Patient has no known allergies.   Review of Systems Review of Systems  Constitutional:  Negative for chills and fever.  HENT:  Positive for congestion, postnasal drip, rhinorrhea, sinus pressure, sinus pain, sneezing and sore throat.        Facial pressure bilateral  Eyes: Negative.   Respiratory:  Positive for cough. Negative for shortness of breath.   Cardiovascular:  Positive for chest pain.       Chest pain only with cough none currently  Gastrointestinal: Negative.   Skin: Negative.   Neurological: Negative.     Physical Exam Triage Vital Signs ED Triage Vitals  Enc Vitals Group     BP 02/11/22 1108 120/77     Pulse Rate 02/11/22 1108 88     Resp 02/11/22 1108 16     Temp 02/11/22 1108 97.8 F (36.6 C)     Temp Source 02/11/22 1108 Oral     SpO2 02/11/22 1108 98 %     Weight --      Height --      Head Circumference --      Peak Flow --      Pain Score 02/11/22  1107 7     Pain Loc --      Pain Edu? --      Excl. in [place]? --    No data found.  Updated Vital Signs BP 120/77 (BP Location: Right Arm)   Pulse 88   Temp 97.8 F (36.6 C) (Oral)   Resp 16   SpO2 98%   Visual Acuity Right Eye Distance:   Left Eye Distance:   Bilateral Distance:    Right Eye Near:   Left Eye Near:    Bilateral Near:     Physical Exam Constitutional:      Appearance: He is well-developed.  HENT:     Right Ear: Tympanic membrane normal.     Left Ear: Tympanic membrane normal.     Nose: Congestion and rhinorrhea present.     Comments: Bilateral sinus pressure upon palpation    Mouth/Throat:     Mouth: No oral lesions.     Pharynx: Pharyngeal swelling and posterior oropharyngeal erythema present.     Tonsils: 2+ on the right. 2+ on the left.  Eyes:     Pupils: Pupils are equal, round, and reactive to light.  Cardiovascular:     Rate and Rhythm: Normal rate.   Pulmonary:     Effort: Pulmonary effort is normal.     Breath sounds: Normal breath sounds.  Abdominal:     Palpations: Abdomen is soft.  Skin:    General: Skin is warm.  Neurological:     General: No focal deficit present.     Mental Status: He is alert.     UC Treatments / Results  Labs (all labs ordered are listed, but only abnormal results are displayed) Labs Reviewed  GROUP A STREP BY PCR    EKG   Radiology No results found.  Procedures Procedures (including critical care time)  Medications Ordered in UC Medications - No data to display  Initial Impression / Assessment and Plan / UC Course  I have reviewed the triage vital signs and the nursing notes.  Pertinent labs & imaging results that were available during my care of the patient were reviewed by me and considered in my medical decision making (see chart for details).     We will treat you with antibiotics for sinusitis today If your strep test returns positive we will give you a call for treatment Take Xyzal every night for at least a week to see if this helps Make sure to eat with medication to prevent nausea and upset stomach  Final Clinical Impressions(s) / UC Diagnoses   Final diagnoses:  Streptococcal sore throat  Acute pharyngitis, unspecified etiology  Chronic frontal sinusitis   Discharge Instructions   None    ED Prescriptions     Medication Sig Dispense Auth. Provider   azithromycin (ZITHROMAX Z-PAK) 250 MG tablet Take 1 tablet (250 mg total) by mouth daily. 6 tablet Maple Mirza L, NP   levocetirizine (XYZAL) 5 MG tablet Take 1 tablet (5 mg total) by mouth every evening. 30 tablet Maple Mirza L, NP   fluticasone (FLONASE) 50 MCG/ACT nasal spray Place 1 spray into both nostrils daily. 16 g Coralyn Mark, NP      PDMP not reviewed this encounter.   Coralyn Mark, NP 02/11/22 1138

## 2022-02-11 NOTE — ED Triage Notes (Signed)
Patient presents to Urgent Care with complaints of wet cough, SOB, sore throat, hoarseness, chest discomfort when coughing x 5 days ago.Treating symptoms with OTC cough med and lozenges.   Denies fever.

## 2022-03-29 ENCOUNTER — Encounter: Payer: Self-pay | Admitting: Emergency Medicine

## 2022-03-29 ENCOUNTER — Emergency Department
Admission: EM | Admit: 2022-03-29 | Discharge: 2022-03-29 | Disposition: A | Discharge status: Home / Self Care | Payer: Self-pay | Attending: Emergency Medicine | Admitting: Emergency Medicine

## 2022-03-29 ENCOUNTER — Other Ambulatory Visit: Payer: Self-pay

## 2022-03-29 DIAGNOSIS — R000 Tachycardia, unspecified: Secondary | ICD-10-CM | POA: Insufficient documentation

## 2022-03-29 DIAGNOSIS — B349 Viral infection, unspecified: Secondary | ICD-10-CM | POA: Insufficient documentation

## 2022-03-29 DIAGNOSIS — Z20822 Contact with and (suspected) exposure to covid-19: Secondary | ICD-10-CM | POA: Insufficient documentation

## 2022-03-29 DIAGNOSIS — R509 Fever, unspecified: Secondary | ICD-10-CM

## 2022-03-29 LAB — COMPREHENSIVE METABOLIC PANEL
ALT: 24 U/L (ref 0–44)
AST: 24 U/L (ref 15–41)
Albumin: 4.7 g/dL (ref 3.5–5.0)
Alkaline Phosphatase: 89 U/L (ref 38–126)
Anion gap: 7 (ref 5–15)
BUN: 9 mg/dL (ref 6–20)
CO2: 25 mmol/L (ref 22–32)
Calcium: 9.6 mg/dL (ref 8.9–10.3)
Chloride: 107 mmol/L (ref 98–111)
Creatinine, Ser: 0.95 mg/dL (ref 0.61–1.24)
GFR, Estimated: >60 mL/min (ref >60)
Glucose, Bld: 126 mg/dL — ABNORMAL HIGH (ref 70–99)
Potassium: 4.5 mmol/L (ref 3.5–5.1)
Sodium: 139 mmol/L (ref 135–145)
Total Bilirubin: 1.2 mg/dL (ref 0.3–1.2)
Total Protein: 8.3 g/dL — ABNORMAL HIGH (ref 6.5–8.1)

## 2022-03-29 LAB — TROPONIN I (HIGH SENSITIVITY): Troponin I (High Sensitivity): <2 ng/L (ref <18)

## 2022-03-29 LAB — URINALYSIS, ROUTINE W REFLEX MICROSCOPIC
Bilirubin Urine: NEGATIVE
Glucose, UA: NEGATIVE mg/dL
Hgb urine dipstick: NEGATIVE
Ketones, ur: 80 mg/dL — AB
Leukocytes,Ua: NEGATIVE
Nitrite: NEGATIVE
Protein, ur: NEGATIVE mg/dL
Specific Gravity, Urine: 1.014 (ref 1.005–1.030)
pH: 5 (ref 5.0–8.0)

## 2022-03-29 LAB — CBC WITH DIFFERENTIAL/PLATELET
Abs Immature Granulocytes: 0.08 10*3/uL — ABNORMAL HIGH (ref 0.00–0.07)
Basophils Absolute: 0 10*3/uL (ref 0.0–0.1)
Basophils Relative: 0 %
Eosinophils Absolute: 0 10*3/uL (ref 0.0–0.5)
Eosinophils Relative: 0 %
HCT: 48.8 % (ref 39.0–52.0)
Hemoglobin: 16.4 g/dL (ref 13.0–17.0)
Immature Granulocytes: 1 %
Lymphocytes Relative: 12 %
Lymphs Abs: 1.9 10*3/uL (ref 0.7–4.0)
MCH: 29.4 pg (ref 26.0–34.0)
MCHC: 33.6 g/dL (ref 30.0–36.0)
MCV: 87.6 fL (ref 80.0–100.0)
Monocytes Absolute: 1.3 10*3/uL — ABNORMAL HIGH (ref 0.1–1.0)
Monocytes Relative: 8 %
Neutro Abs: 12.4 10*3/uL — ABNORMAL HIGH (ref 1.7–7.7)
Neutrophils Relative %: 79 %
Platelets: 265 10*3/uL (ref 150–400)
RBC: 5.57 MIL/uL (ref 4.22–5.81)
RDW: 11.7 % (ref 11.5–15.5)
WBC: 15.7 10*3/uL — ABNORMAL HIGH (ref 4.0–10.5)
nRBC: 0 % (ref 0.0–0.2)

## 2022-03-29 LAB — LACTIC ACID, PLASMA
Lactic Acid, Venous: 2.8 mmol/L (ref 0.5–1.9)
Lactic Acid, Venous: 2.1 mmol/L (ref 0.5–1.9)

## 2022-03-29 LAB — GROUP A STREP BY PCR: Group A Strep by PCR: NOT DETECTED

## 2022-03-29 LAB — SARS CORONAVIRUS 2 BY RT PCR: SARS Coronavirus 2 by RT PCR: NEGATIVE

## 2022-03-29 LAB — LIPASE, BLOOD: Lipase: 26 U/L (ref 11–51)

## 2022-03-29 LAB — TSH: TSH: 0.771 u[IU]/mL (ref 0.350–4.500)

## 2022-03-29 MED ORDER — NAPROXEN 500 MG PO TABS: 500.0000 mg | ORAL_TABLET | Freq: Two times a day (BID) | ORAL | 0 refills | Status: DC

## 2022-03-29 MED ORDER — ONDANSETRON 4 MG PO TBDP: 4.0000 mg | ORAL_TABLET | Freq: Three times a day (TID) | ORAL | 0 refills | Status: DC | PRN

## 2022-03-29 MED ORDER — SODIUM CHLORIDE 0.9 % IV BOLUS
1000.0000 mL | Freq: Once | INTRAVENOUS | Status: AC
  Administered 2022-03-29: 1000 mL via INTRAVENOUS

## 2022-03-29 MED ORDER — DEXAMETHASONE SODIUM PHOSPHATE 10 MG/ML IJ SOLN
10.0000 mg | Freq: Once | INTRAMUSCULAR | Status: AC
  Administered 2022-03-29: 10 mg via INTRAVENOUS
  Filled 2022-03-29: qty 1

## 2022-03-29 MED ORDER — KETOROLAC TROMETHAMINE 15 MG/ML IJ SOLN
15.0000 mg | Freq: Once | INTRAMUSCULAR | Status: AC
  Administered 2022-03-29: 15 mg via INTRAVENOUS
  Filled 2022-03-29: qty 1

## 2022-03-29 NOTE — ED Triage Notes (Signed)
Pt arrived via POV with reports of HA, sore throat, dizziness with standing, fever sxs began yesterday. Pt reports nausea as well.  PT reports tmax of 103.4 at home, last took Tylenol around 8pm last night.  Pt able to move his neck, but c/o pain where neck and shoulders meet.

## 2022-03-29 NOTE — ED Provider Notes (Signed)
[facility] Provider Note    Event Date/Time   First MD Initiated Contact with Patient 03/29/22 [phone]     (approximate)   History   Chief Complaint: Fever, Headache, and Neck Pain   HPI  Timothy James is a 26 y.o. male with no significant past medical history who comes the ED complaining of bilateral frontal headache, sore throat, dizziness with standing, body aches, malaise, nausea that started 2 days ago, gradual onset and worsening.  Tmax of 103 at home.  No antipyretics this morning so far.  No focal abdominal pain.  No vomiting or diarrhea or constipation.  No chest pain or shortness of breath.  No neck stiffness.     Physical Exam   Triage Vital Signs: ED Triage Vitals  Enc Vitals Group     BP 03/29/22 0523 (!) 157/100     Pulse Rate 03/29/22 0523 (!) 127     Resp 03/29/22 0523 18     Temp 03/29/22 0523 99.3 F (37.4 C)     Temp Source 03/29/22 0523 Oral     SpO2 03/29/22 0523 97 %     Weight 03/29/22 0522 157 lb (71.2 kg)     Height 03/29/22 0522 6\' 2"  (1.88 m)     Head Circumference --      Peak Flow --      Pain Score 03/29/22 0522 7     Pain Loc --      Pain Edu? --      Excl. in [place]? --     Most recent vital signs: Vitals:   03/29/22 0900 03/29/22 0930  BP: 102/65 103/60  Pulse: (!) 103 95  Resp:    Temp:    SpO2: 97% 97%    General: Awake, no distress.  Nontoxic CV:  Good peripheral perfusion.  Tachycardia, heart rate 130.  Symmetric peripheral pulses.  Heart sounds are clear. Resp:  Normal effort.  Clear to auscultation bilaterally Abd:  No distention.  Mild diffuse tenderness, nonfocal.  Abdomen is soft without rigidity rebound or guarding Other:  Oropharynx is diffusely erythematous without asymmetry or mass.  No tongue elevation.  Managing secretions normally.  No cervical lymphadenopathy.  Thyroid nonpalpable, nontender.  Full range of motion in the neck without pain.  There is tenseness and tenderness of the  bilateral trapezius muscles.   ED Results / Procedures / Treatments   Labs (all labs ordered are listed, but only abnormal results are displayed) Labs Reviewed  LACTIC ACID, PLASMA - Abnormal; Notable for the following components:      Result Value   Lactic Acid, Venous 2.8 (*)    All other components within normal limits  LACTIC ACID, PLASMA - Abnormal; Notable for the following components:   Lactic Acid, Venous 2.1 (*)    All other components within normal limits  COMPREHENSIVE METABOLIC PANEL - Abnormal; Notable for the following components:   Glucose, Bld 126 (*)    Total Protein 8.3 (*)    All other components within normal limits  CBC WITH DIFFERENTIAL/PLATELET - Abnormal; Notable for the following components:   WBC 15.7 (*)    Neutro Abs 12.4 (*)    Monocytes Absolute 1.3 (*)    Abs Immature Granulocytes 0.08 (*)    All other components within normal limits  URINALYSIS, ROUTINE W REFLEX MICROSCOPIC - Abnormal; Notable for the following components:   Color, Urine YELLOW (*)    APPearance CLEAR (*)    Ketones, ur  80 (*)    All other components within normal limits  GROUP A STREP BY PCR  SARS CORONAVIRUS 2 BY RT PCR  LIPASE, BLOOD  TSH  TROPONIN I (HIGH SENSITIVITY)     EKG Interpreted by me Sinus tachycardia rate 135.  Normal axis intervals QRS ST segments and T waves.  Not consistent with pericarditis   RADIOLOGY    PROCEDURES:  Procedures   MEDICATIONS ORDERED IN ED: Medications  ketorolac (TORADOL) 15 MG/ML injection 15 mg (15 mg Intravenous Given 03/29/22 0800)  dexamethasone (DECADRON) injection 10 mg (10 mg Intravenous Given 03/29/22 0759)  sodium chloride 0.9 % bolus 1,000 mL (0 mLs Intravenous Stopped 03/29/22 0947)     IMPRESSION / MDM / ASSESSMENT AND PLAN / ED COURSE  I reviewed the triage vital signs and the nursing notes.                              Differential diagnosis includes, but is not limited to, viral syndrome, appendicitis,  diverticulitis, pancreatitis, hyperthyroidism  Patient's presentation is most consistent with acute presentation with potential threat to life or bodily function.  Patient presents with fever, tachycardia.  He is nontoxic and not septic.  I do not see any evidence of RPA, PTA, meningitis, encephalitis, soft tissue infection, pneumonia, UTI.  Presentation is very consistent with a viral syndrome.  We will give IV Toradol and Decadron and IV fluids for hydration and anti-inflammatory/pain relief.  Will reassess given nonfocal abdominal exam.   ----------------------------------------- 10:41 AM on 03/29/2022 ----------------------------------------- Pt feels much better. Remaining labs reassuring. Abd reasssessed, still soft and nonfocal. Discussed possibility of appy, pt comfortable with deferring CT scan for now. Anticipatory guidance and return precautions discussed.       FINAL CLINICAL IMPRESSION(S) / ED DIAGNOSES   Final diagnoses:  Fever, unspecified fever cause  Acute viral syndrome     Rx / DC Orders   ED Discharge Orders          Ordered    naproxen (NAPROSYN) 500 MG tablet  2 times daily with meals        03/29/22 1041    ondansetron (ZOFRAN-ODT) 4 MG disintegrating tablet  Every 8 hours PRN        03/29/22 1041             Note:  This document was prepared using Dragon voice recognition software and may include unintentional dictation errors.   Sharman Cheek, MD 03/29/22 [phone]

## 2022-04-04 ENCOUNTER — Other Ambulatory Visit: Payer: Self-pay

## 2022-04-04 ENCOUNTER — Emergency Department: Payer: Self-pay

## 2022-04-04 ENCOUNTER — Emergency Department
Admission: EM | Admit: 2022-04-04 | Discharge: 2022-04-05 | Disposition: A | Discharge status: Home / Self Care | Payer: Self-pay | Attending: Emergency Medicine | Admitting: Emergency Medicine

## 2022-04-04 DIAGNOSIS — N200 Calculus of kidney: Secondary | ICD-10-CM | POA: Insufficient documentation

## 2022-04-04 LAB — CBC
HCT: 45.8 % (ref 39.0–52.0)
Hemoglobin: 15.6 g/dL (ref 13.0–17.0)
MCH: 29.2 pg (ref 26.0–34.0)
MCHC: 34.1 g/dL (ref 30.0–36.0)
MCV: 85.6 fL (ref 80.0–100.0)
Platelets: 408 10*3/uL — ABNORMAL HIGH (ref 150–400)
RBC: 5.35 MIL/uL (ref 4.22–5.81)
RDW: 11.6 % (ref 11.5–15.5)
WBC: 11.8 10*3/uL — ABNORMAL HIGH (ref 4.0–10.5)
nRBC: 0 % (ref 0.0–0.2)

## 2022-04-04 LAB — URINALYSIS, ROUTINE W REFLEX MICROSCOPIC
Glucose, UA: NEGATIVE mg/dL
Ketones, ur: >160 mg/dL — AB
Leukocytes,Ua: NEGATIVE
Nitrite: NEGATIVE
Protein, ur: 100 mg/dL — AB
Specific Gravity, Urine: 1.02 (ref 1.005–1.030)
pH: 5.5 (ref 5.0–8.0)

## 2022-04-04 LAB — URINALYSIS, MICROSCOPIC (REFLEX): RBC / HPF: >50 RBC/hpf (ref 0–5)

## 2022-04-04 MED ORDER — KETOROLAC TROMETHAMINE 30 MG/ML IJ SOLN: 30.0000 mg | Freq: Once | INTRAMUSCULAR | Status: DC

## 2022-04-04 MED ORDER — SODIUM CHLORIDE 0.9 % IV BOLUS
1000.0000 mL | Freq: Once | INTRAVENOUS | Status: AC
  Administered 2022-04-04: 1000 mL via INTRAVENOUS

## 2022-04-04 MED ORDER — TAMSULOSIN HCL 0.4 MG PO CAPS: 0.4000 mg | ORAL_CAPSULE | Freq: Once | ORAL | Status: DC

## 2022-04-04 MED ORDER — KETOROLAC TROMETHAMINE 10 MG PO TABS: 10.0000 mg | ORAL_TABLET | Freq: Three times a day (TID) | ORAL | 0 refills | Status: DC | PRN

## 2022-04-04 MED ORDER — TAMSULOSIN HCL 0.4 MG PO CAPS: 0.4000 mg | ORAL_CAPSULE | Freq: Every day | ORAL | 0 refills | Status: DC

## 2022-04-04 NOTE — Discharge Instructions (Signed)
Please seek medical attention for any high fevers, chest pain, shortness of breath, change in behavior, persistent vomiting, bloody stool or any other new or concerning symptoms.  

## 2022-04-04 NOTE — ED Notes (Signed)
Pt ambulated to toilet and back to bed. 

## 2022-04-04 NOTE — ED Triage Notes (Addendum)
Pt tsates began to have left lower abd pain 15/10 tonight. Pt states his pain is gone now. Pt appears in no acute distress. Pt with darkened urine noted.

## 2022-04-05 NOTE — ED Provider Notes (Signed)
[facility] Provider Note    Event Date/Time   First MD Initiated Contact with Patient 04/04/22 2212     (approximate)   History   Abdominal Pain   HPI  Timothy James is a 26 y.o. male who presented to the emergency department today because of concerns for left lower quadrant pain.  Patient states that the pain started today.  It has been somewhat intermittent.  At its worst it was very severe.  He denies any associated nausea or vomiting.  Did notice that his urine was dark at 1 time.  Patient denies similar symptoms in the past.  Denies any recent fevers.    Physical Exam   Triage Vital Signs: ED Triage Vitals [04/04/22 2128]  Enc Vitals Group     BP (!) 128/100     Pulse Rate 100     Resp 20     Temp 98.2 F (36.8 C)     Temp Source Oral     SpO2 97 %     Weight 158 lb 11.7 oz (72 kg)     Height 6\' 2"  (1.88 m)     Head Circumference      Peak Flow      Pain Score 0     Pain Loc      Pain Edu?      Excl. in GC?     Most recent vital signs: Vitals:   04/04/22 2128 04/04/22 2245  BP: (!) 128/100 133/79  Pulse: 100 94  Resp: 20 18  Temp: 98.2 F (36.8 C)   SpO2: 97% 97%     General: Awake, alert, oriented. CV:  Good peripheral perfusion. Regular rate and rhythm. Resp:  Normal effort. Lungs clear. Abd:  No distention. Tender to palpation in the left lower quadrant.  ED Results / Procedures / Treatments   Labs (all labs ordered are listed, but only abnormal results are displayed) Labs Reviewed  CBC - Abnormal; Notable for the following components:      Result Value   WBC 11.8 (*)    Platelets 408 (*)    All other components within normal limits  URINALYSIS, ROUTINE W REFLEX MICROSCOPIC - Abnormal; Notable for the following components:   Hgb urine dipstick LARGE (*)    Bilirubin Urine MODERATE (*)    Ketones, ur >160 (*)    Protein, ur 100 (*)    All other components within normal limits  URINALYSIS, MICROSCOPIC  (REFLEX) - Abnormal; Notable for the following components:   Bacteria, UA FEW (*)    All other components within normal limits     EKG  None   RADIOLOGY I independently interpreted and visualized the CT renal stone study. My interpretation: Left UVJ stone Radiology interpretation:  IMPRESSION:  Left nephrolithiasis.    2 mm left UVJ stone.  Mild left hydronephrosis.     PROCEDURES:  Critical Care performed: No  Procedures   MEDICATIONS ORDERED IN ED: Medications  ketorolac (TORADOL) 30 MG/ML injection 30 mg (has no administration in time range)  tamsulosin (FLOMAX) capsule 0.4 mg (has no administration in time range)  sodium chloride 0.9 % bolus 1,000 mL (1,000 mLs Intravenous New Bag/Given 04/04/22 2244)     IMPRESSION / MDM / ASSESSMENT AND PLAN / ED COURSE  I reviewed the triage vital signs and the nursing notes.  Differential diagnosis includes, but is not limited to, kidney stone, diverticulitis, musculoskeletal pain, UTI.  Patient's presentation is most consistent with acute presentation with potential threat to life or bodily function.  Patient presented to the emergency department today because of concerns for left lower quadrant pain.  Urine did have blood in it.  CT scan was obtained which did show a left UVJ stone.  I do think this could explain the patient's pain.  Discussed this finding with the patient.  Will give patient kidney stone information.  Patient was given prescription for Toradol and Flomax.  FINAL CLINICAL IMPRESSION(S) / ED DIAGNOSES   Final diagnoses:  Kidney stone     Note:  This document was prepared using Dragon voice recognition software and may include unintentional dictation errors.    Phineas Semen, MD 04/05/22 [phone]

## 2022-04-08 ENCOUNTER — Telehealth: Payer: Self-pay

## 2022-04-08 NOTE — Telephone Encounter (Signed)
Informed patient about the [facility] per ER referral and to call back if interested in establishing care

## 2022-09-02 ENCOUNTER — Ambulatory Visit (INDEPENDENT_AMBULATORY_CARE_PROVIDER_SITE_OTHER): Payer: Self-pay

## 2022-09-02 ENCOUNTER — Encounter: Payer: Self-pay | Admitting: Emergency Medicine

## 2022-09-02 ENCOUNTER — Emergency Department
Admission: EM | Admit: 2022-09-02 | Discharge: 2022-09-02 | Disposition: A | Discharge status: Home / Self Care | Payer: Self-pay | Attending: Emergency Medicine | Admitting: Emergency Medicine

## 2022-09-02 ENCOUNTER — Other Ambulatory Visit: Payer: Self-pay

## 2022-09-02 ENCOUNTER — Ambulatory Visit
Admission: EM | Admit: 2022-09-02 | Discharge: 2022-09-02 | Disposition: A | Discharge status: Home / Self Care | Payer: Self-pay | Attending: Internal Medicine | Admitting: Internal Medicine

## 2022-09-02 DIAGNOSIS — N23 Unspecified renal colic: Secondary | ICD-10-CM | POA: Insufficient documentation

## 2022-09-02 DIAGNOSIS — N1330 Unspecified hydronephrosis: Secondary | ICD-10-CM | POA: Insufficient documentation

## 2022-09-02 DIAGNOSIS — R109 Unspecified abdominal pain: Secondary | ICD-10-CM | POA: Insufficient documentation

## 2022-09-02 LAB — BASIC METABOLIC PANEL
Anion gap: 8 (ref 5–15)
BUN: 13 mg/dL (ref 6–20)
CO2: 24 mmol/L (ref 22–32)
Calcium: 9.5 mg/dL (ref 8.9–10.3)
Chloride: 107 mmol/L (ref 98–111)
Creatinine, Ser: 1.11 mg/dL (ref 0.61–1.24)
GFR, Estimated: >60 mL/min (ref >60)
Glucose, Bld: 112 mg/dL — ABNORMAL HIGH (ref 70–99)
Potassium: 4.7 mmol/L (ref 3.5–5.1)
Sodium: 139 mmol/L (ref 135–145)

## 2022-09-02 LAB — URINALYSIS, ROUTINE W REFLEX MICROSCOPIC
Bilirubin Urine: NEGATIVE
Glucose, UA: NEGATIVE mg/dL
Leukocytes,Ua: NEGATIVE
Nitrite: NEGATIVE
Protein, ur: 30 mg/dL — AB
Specific Gravity, Urine: 1.02 (ref 1.005–1.030)
pH: 7 (ref 5.0–8.0)

## 2022-09-02 LAB — CBC WITH DIFFERENTIAL/PLATELET
Abs Immature Granulocytes: 0.05 10*3/uL (ref 0.00–0.07)
Basophils Absolute: 0 10*3/uL (ref 0.0–0.1)
Basophils Relative: 0 %
Eosinophils Absolute: 0 10*3/uL (ref 0.0–0.5)
Eosinophils Relative: 0 %
HCT: 43.8 % (ref 39.0–52.0)
Hemoglobin: 14.7 g/dL (ref 13.0–17.0)
Immature Granulocytes: 0 %
Lymphocytes Relative: 13 %
Lymphs Abs: 1.6 10*3/uL (ref 0.7–4.0)
MCH: 29.2 pg (ref 26.0–34.0)
MCHC: 33.6 g/dL (ref 30.0–36.0)
MCV: 86.9 fL (ref 80.0–100.0)
Monocytes Absolute: 0.8 10*3/uL (ref 0.1–1.0)
Monocytes Relative: 6 %
Neutro Abs: 10.4 10*3/uL — ABNORMAL HIGH (ref 1.7–7.7)
Neutrophils Relative %: 81 %
Platelets: 292 10*3/uL (ref 150–400)
RBC: 5.04 MIL/uL (ref 4.22–5.81)
RDW: 11.9 % (ref 11.5–15.5)
WBC: 13 10*3/uL — ABNORMAL HIGH (ref 4.0–10.5)
nRBC: 0 % (ref 0.0–0.2)

## 2022-09-02 LAB — URINALYSIS, MICROSCOPIC (REFLEX): RBC / HPF: >50 RBC/hpf (ref 0–5)

## 2022-09-02 MED ORDER — KETOROLAC TROMETHAMINE 60 MG/2ML IM SOLN
60.0000 mg | Freq: Once | INTRAMUSCULAR | Status: AC
  Administered 2022-09-02: 60 mg via INTRAMUSCULAR

## 2022-09-02 MED ORDER — OXYCODONE-ACETAMINOPHEN 5-325 MG PO TABS
1.0000 | ORAL_TABLET | Freq: Once | ORAL | Status: AC
  Administered 2022-09-02: 1 via ORAL
  Filled 2022-09-02: qty 1

## 2022-09-02 MED ORDER — ONDANSETRON 4 MG PO TBDP: 4.0000 mg | ORAL_TABLET | Freq: Three times a day (TID) | ORAL | 0 refills | Status: AC | PRN

## 2022-09-02 MED ORDER — SULFAMETHOXAZOLE-TRIMETHOPRIM 800-160 MG PO TABS
1.0000 | ORAL_TABLET | Freq: Once | ORAL | Status: AC
  Administered 2022-09-02: 1 via ORAL
  Filled 2022-09-02: qty 1

## 2022-09-02 MED ORDER — OXYCODONE-ACETAMINOPHEN 5-325 MG PO TABS: 1.0000 | ORAL_TABLET | Freq: Four times a day (QID) | ORAL | 0 refills | Status: AC | PRN

## 2022-09-02 MED ORDER — TAMSULOSIN HCL 0.4 MG PO CAPS: 0.4000 mg | ORAL_CAPSULE | Freq: Every day | ORAL | 0 refills | Status: DC

## 2022-09-02 MED ORDER — ONDANSETRON 4 MG PO TBDP
4.0000 mg | ORAL_TABLET | Freq: Once | ORAL | Status: AC
  Administered 2022-09-02: 4 mg via ORAL
  Filled 2022-09-02: qty 1

## 2022-09-02 MED ORDER — SULFAMETHOXAZOLE-TRIMETHOPRIM 800-160 MG PO TABS
1.0000 | ORAL_TABLET | Freq: Two times a day (BID) | ORAL | 0 refills | Status: AC
Start: 2022-09-02 — End: 2022-09-09

## 2022-09-02 NOTE — ED Notes (Signed)
Patient is being discharged from the Urgent Care and sent to the Emergency Department via POV . Per Timothy James, patient is in need of higher level of care due to renal colic and left kidney stone. Patient is aware and verbalizes understanding of plan of care.  Vitals:   09/02/22 1331  BP: (!) 131/91  Pulse: 92  Resp: 16  Temp: 98.7 F (37.1 C)  SpO2: 100%

## 2022-09-02 NOTE — ED Provider Notes (Signed)
[facility]    CSN: [id number] Arrival date & time: 09/02/22  1300      History   Chief Complaint Chief Complaint  Patient presents with   Abdominal Pain   Constipation    HPI Timothy James is a 26 y.o. male presents with L mid abdominal pain radiating to his back since this am.  Had kidney stone 6 months ago. His GF sufers with them.  He also states he had stomach virus for 2 days up til 3 days ago, and after that has noticed non bloody mucous on the formed stool, which as the days went by has been less. He took 1000 mg of Tylenol at 10 am and has not helped. He passed the stone from 6 months ago with Flomax and Toradol, but did not collect the stone.     History reviewed. No pertinent past medical history.  Patient Active Problem List   Diagnosis Date Noted   Anxiety 02/28/2013   Palpitations 02/28/2013    Past Surgical History:  Procedure Laterality Date   TONSILLECTOMY       Home Medications    Prior to Admission medications   Medication Sig Start Date End Date Taking? Authorizing Provider  fluticasone (FLONASE) 50 MCG/ACT nasal spray Place 1 spray into both nostrils daily. 02/11/22   Coralyn Mark, NP  ibuprofen (MOTRIN IB) 200 MG tablet Take 1 tablet (200 mg total) by mouth every 6 (six) hours as needed. 03/23/19   Enid Derry, PA-C  ketorolac (TORADOL) 10 MG tablet Take 1 tablet (10 mg total) by mouth every 8 (eight) hours as needed for severe pain. 04/04/22   Phineas Semen, MD  levocetirizine (XYZAL) 5 MG tablet Take 1 tablet (5 mg total) by mouth every evening. 02/11/22   Coralyn Mark, NP  naproxen (NAPROSYN) 500 MG tablet Take 1 tablet (500 mg total) by mouth 2 (two) times daily with a meal. 03/29/22   Triplett, Cari B, FNP  ondansetron (ZOFRAN-ODT) 4 MG disintegrating tablet Take 1 tablet (4 mg total) by mouth every 8 (eight) hours as needed for nausea or vomiting. 03/29/22   Triplett, Cari B, FNP  pantoprazole (PROTONIX) 40 MG  tablet Take 1 tablet (40 mg total) by mouth daily. 10/30/21 10/30/22  Nita Sickle, MD  tamsulosin (FLOMAX) 0.4 MG CAPS capsule Take 1 capsule (0.4 mg total) by mouth daily. 04/04/22   Phineas Semen, MD    Family History Family History  Problem Relation Age of Onset   Hypertension Mother    Diabetes Father    Other Father 18       traumatic brain injury    Social History Social History   Tobacco Use   Smoking status: Never   Smokeless tobacco: Never  Vaping Use   Vaping Use: Never used  Substance Use Topics   Alcohol use: Not Currently   Drug use: Not Currently     Allergies   Patient has no known allergies.   Review of Systems Review of Systems  Constitutional:  Positive for appetite change. Negative for chills and fever.  Gastrointestinal:  Positive for abdominal pain and nausea. Negative for blood in stool and vomiting.  Genitourinary:  Positive for flank pain.  Skin:  Negative for rash.     Physical Exam Triage Vital Signs ED Triage Vitals  Enc Vitals Group     BP 09/02/22 1331 (!) 131/91     Pulse Rate 09/02/22 1331 92     Resp 09/02/22 1331  16     Temp 09/02/22 1331 98.7 F (37.1 C)     Temp Source 09/02/22 1331 Oral     SpO2 09/02/22 1331 100 %     Weight --      Height --      Head Circumference --      Peak Flow --      Pain Score 09/02/22 1330 10     Pain Loc --      Pain Edu? --      Excl. in GC? --    No data found.  Updated Vital Signs BP (!) 131/91 (BP Location: Right Arm)   Pulse 92   Temp 98.7 F (37.1 C) (Oral)   Resp 16   SpO2 100%   Visual Acuity Right Eye Distance:   Left Eye Distance:   Bilateral Distance:    Right Eye Near:   Left Eye Near:    Bilateral Near:      Physical Exam Vitals and nursing note reviewed.  Constitutional:      General: he is in moderate pain    Appearance: he is not toxic-appearing.  HENT:     Head: Normocephalic.     Right Ear: External ear normal.     Left Ear: External ear  normal.  Eyes:     General: No scleral icterus.    Conjunctiva/sclera: Conjunctivae normal.  Pulmonary:     Effort: Pulmonary effort is normal.  Abdominal:     General: Bowel sounds are normal.     Palpations: Abdomen is soft. There is no mass.     Tenderness: There is moderate pain on L mid abdomen with guarding     Comments: + CVA tenderness  on the L Musculoskeletal:        General: Normal range of motion.     Cervical back: Neck supple.      Skin:    General: Skin is warm and dry.     Findings: No rash.  Neurological:     Mental Status: he is alert and oriented to person, place, and time.     Gait: Gait normal.  Psychiatric:        Mood and Affect: Mood normal.        Behavior: Behavior normal.        Thought Content: Thought content normal.        Judgment: Judgment normal.    UC Treatments / Results  Labs (all labs ordered are listed, but only abnormal results are displayed) Labs Reviewed  URINALYSIS, ROUTINE W REFLEX MICROSCOPIC - Abnormal; Notable for the following components:      Result Value   APPearance HAZY (*)    Hgb urine dipstick LARGE (*)    Ketones, ur TRACE (*)    Protein, ur 30 (*)    All other components within normal limits  URINALYSIS, MICROSCOPIC (REFLEX) - Abnormal; Notable for the following components:   Bacteria, UA FEW (*)    All other components within normal limits    EKG   Radiology CT Renal Stone Study  Result Date: 09/02/2022 CLINICAL DATA:  Left-sided flank pain EXAM: CT ABDOMEN AND PELVIS WITHOUT CONTRAST TECHNIQUE: Multidetector CT imaging of the abdomen and pelvis was performed following the standard protocol without IV contrast. RADIATION DOSE REDUCTION: This exam was performed according to the departmental dose-optimization program which includes automated exposure control, adjustment of the mA and/or kV according to patient size and/or use of iterative reconstruction technique. COMPARISON:  CT  04/04/2022 FINDINGS: Lower chest:  Lung bases demonstrate no acute airspace disease Hepatobiliary: No calcified gallstone. Probable focal fat deposition near falciform ligament. No biliary dilatation Pancreas: Unremarkable. No pancreatic ductal dilatation or surrounding inflammatory changes. Spleen: Normal in size without focal abnormality. Adrenals/Urinary Tract: Adrenal glands are normal. Slightly dense medullary pyramids on the right without well-formed stone or hydronephrosis. There are multiple small intrarenal stones on the left measuring up to 2 mm. Mild left hydronephrosis and proximal hydroureter, secondary to a 6 x 3 mm stone in the proximal left ureter about 2 cm distal to the left UPJ. The urinary bladder is empty. Stomach/Bowel: Stomach is within normal limits. Appendix appears normal. No evidence of bowel wall thickening, distention, or inflammatory changes. Vascular/Lymphatic: No significant vascular findings are present. No enlarged abdominal or pelvic lymph nodes. Reproductive: Prostate is unremarkable. Other: No abdominal wall hernia or abnormality. No abdominopelvic ascites. Musculoskeletal: No acute or significant osseous findings. IMPRESSION: Mild left hydronephrosis and proximal hydroureter, secondary to a 6 x 3 mm stone in the proximal left ureter about 2 cm distal to the left UPJ. Multiple small left intrarenal stones. These results will be called to the ordering clinician or representative by the Radiologist Assistant, and communication documented in the PACS or [employer]. Electronically Signed   By: Jasmine Pang M.D.   On: 09/02/2022 15:40    Procedures Procedures (including critical care time)  Medications Ordered in UC Medications  ketorolac (TORADOL) injection 60 mg (60 mg Intramuscular Given 09/02/22 1523)    Initial Impression / Assessment and Plan / UC Course  I have reviewed the triage vital signs and the nursing notes.  Pertinent labs & imaging results that were available during my care of the  patient were reviewed by me and considered in my medical decision making (see chart for details).  L obstructed renal stone with hydronephrosis  Pt sent to ER for further work up. I explained to him normally the size of the stone present is hard to pass and the fact that he has hydronephrosis is concerning in regards to renal injury, so needs more test and to see urology.    Final Clinical Impressions(s) / UC Diagnoses   Final diagnoses:  Renal colic on left side  Hydronephrosis of left kidney     Discharge Instructions      Urinate in the strainer we gave you and collect the stone, so it can be analyzed in case is related to  food that provokes them. Your have a very large stone and multiple little ones. The large stone is causing your kidney to swell up and normally that size is hard to pass on its own.      ED Prescriptions   None    PDMP not reviewed this encounter.   Garey Ham, PA-C 09/02/22 1607

## 2022-09-02 NOTE — ED Triage Notes (Signed)
Chief Complaint: eft side abdominal pain radiating to back ,mucuous in stool, constipation. States has a feeling of fullness in the stomach. States after eating a sandwich today has had sever left side pain that is not going away. Nausea, dizziness, and no vomiting. States had a stomach bug Sunday and Monday with diarrhea and vomiting.   Onset: 2 days  Prescriptions or OTC medications tried: Yes- tylenol today at 10 am    with no relief  Sick exposure: Yes- his girlfriend and her family.   New foods, medications, or products: Yes- imodium   Recent Travel: No

## 2022-09-02 NOTE — ED Notes (Signed)
Pt is self pay, so does not require prior auth for CT.

## 2022-09-02 NOTE — Discharge Instructions (Addendum)
Urinate in the strainer we gave you and collect the stone, so it can be analyzed in case is related to  food that provokes them. Your have a very large stone and multiple little ones. The large stone is causing your kidney to swell up and normally that size is hard to pass on its own.

## 2022-09-02 NOTE — ED Provider Notes (Signed)
[facility] Provider Note  Patient Contact: 10:48 PM (approximate)   History   Flank Pain   HPI  Timothy James is a 26 y.o. male presents to the emergency department with left-sided flank pain.  Patient was referred from [facility] urgent care given large kidney stone, 6 mm identified in the left ureter.  Patient denies dysuria, chills or fever.  Patient has had nephrolithiasis in the past.      Physical Exam   Triage Vital Signs: ED Triage Vitals  Enc Vitals Group     BP 09/02/22 1808 126/86     Pulse Rate 09/02/22 1808 83     Resp 09/02/22 1808 20     Temp 09/02/22 1808 97.7 F (36.5 C)     Temp Source 09/02/22 1808 Oral     SpO2 09/02/22 1808 94 %     Weight --      Height --      Head Circumference --      Peak Flow --      Pain Score 09/02/22 1722 2     Pain Loc --      Pain Edu? --      Excl. in GC? --     Most recent vital signs: Vitals:   09/02/22 1808  BP: 126/86  Pulse: 83  Resp: 20  Temp: 97.7 F (36.5 C)  SpO2: 94%     General: Alert and in no acute distress. Eyes:  PERRL. EOMI. Head: No acute traumatic findings ENT:      Nose: No congestion/rhinnorhea.      Mouth/Throat: Mucous membranes are moist. Neck: No stridor. No cervical spine tenderness to palpation. Cardiovascular:  Good peripheral perfusion Respiratory: Normal respiratory effort without tachypnea or retractions. Lungs CTAB. Good air entry to the bases with no decreased or absent breath sounds. Gastrointestinal: Bowel sounds 4 quadrants. Soft and nontender to palpation. No guarding or rigidity. No palpable masses. No distention. No CVA tenderness. Genitourinary: Patient has left sided CVA tenderness.  Musculoskeletal: Full range of motion to all extremities.  Neurologic:  No gross focal neurologic deficits are appreciated.  Skin:   No rash noted Other:   ED Results / Procedures / Treatments   Labs (all labs ordered are listed, but only abnormal  results are displayed) Labs Reviewed  CBC WITH DIFFERENTIAL/PLATELET - Abnormal; Notable for the following components:      Result Value   WBC 13.0 (*)    Neutro Abs 10.4 (*)    All other components within normal limits  BASIC METABOLIC PANEL - Abnormal; Notable for the following components:   Glucose, Bld 112 (*)    All other components within normal limits       RADIOLOGY  I personally viewed and evaluated these images as part of my medical decision making, as well as reviewing the written report by the radiologist.  ED Provider Interpretation: Patient has a 6 mm stone 2 mm distal to the left UPJ  PROCEDURES:  Critical Care performed: No  Procedures   MEDICATIONS ORDERED IN ED: Medications  sulfamethoxazole-trimethoprim (BACTRIM DS) 800-160 MG per tablet 1 tablet (1 tablet Oral Given 09/02/22 2145)  oxyCODONE-acetaminophen (PERCOCET/ROXICET) 5-325 MG per tablet 1 tablet (1 tablet Oral Given 09/02/22 2145)  ondansetron (ZOFRAN-ODT) disintegrating tablet 4 mg (4 mg Oral Given 09/02/22 2145)     IMPRESSION / MDM / ASSESSMENT AND PLAN / ED COURSE  I reviewed the triage vital signs and the nursing notes.  Assessment and plan Flank pain 26 year old male presents to the emergency department with left-sided flank pain.  Vital signs were reassuring at triage.  On exam, patient was alert and nontoxic-appearing.  He overall seemed very comfortable.  I reviewed CT renal stone study obtained at urgent care with patient and explained to patient that he still had statistically high likelihood of passing the stone.  Given patient's overall comfort level, I recommended pain management at home and observation of his symptoms.  I did see that patient had some bacteria identified on his urinalysis urgent care today.  Will cover patient with Bactrim twice daily for the next 7 days.  Patient was prescribed a short course of Percocet for pain as well as Flomax.   Patient was cautioned that should he experience fever, worsening pain or multiple episodes of vomiting, to return to the emergency department for reevaluation.      FINAL CLINICAL IMPRESSION(S) / ED DIAGNOSES   Final diagnoses:  Flank pain     Rx / DC Orders   ED Discharge Orders          Ordered    oxyCODONE-acetaminophen (PERCOCET/ROXICET) 5-325 MG tablet  Every 6 hours PRN        09/02/22 2131    ondansetron (ZOFRAN-ODT) 4 MG disintegrating tablet  Every 8 hours PRN        09/02/22 2131    tamsulosin (FLOMAX) 0.4 MG CAPS capsule  Daily        09/02/22 2131    sulfamethoxazole-trimethoprim (BACTRIM DS) 800-160 MG tablet  2 times daily        09/02/22 2131             Note:  This document was prepared using Dragon voice recognition software and may include unintentional dictation errors.   Pia Mau Thoreau, PA-C 09/02/22 2252    Arnaldo Natal, MD 09/02/22 [phone]

## 2022-09-02 NOTE — ED Triage Notes (Addendum)
Pt to ED via POV pt states that he is was seen at [facility] urgent care for left flank pain. Pt was given toradol and it has helped the pain but they told him that he needed to come to the ED. Pt states that he had a CT as well.

## 2022-09-02 NOTE — Discharge Instructions (Signed)
Take Bactrim twice daily for the next 7 days. You can take Percocet for pain. You have been prescribed Zofran which will help with nausea. Take Flomax once daily.

## 2022-09-03 ENCOUNTER — Ambulatory Visit: Payer: Self-pay

## 2022-09-04 ENCOUNTER — Encounter: Payer: Self-pay | Admitting: Physician Assistant

## 2022-09-04 ENCOUNTER — Telehealth: Payer: Self-pay | Admitting: Physician Assistant

## 2022-09-04 DIAGNOSIS — K625 Hemorrhage of anus and rectum: Secondary | ICD-10-CM

## 2022-09-04 DIAGNOSIS — R1030 Lower abdominal pain, unspecified: Secondary | ICD-10-CM

## 2022-09-04 NOTE — Progress Notes (Signed)
Virtual Visit Consent   Timothy James, you are scheduled for a virtual visit with a [facility] provider today. Just as with appointments in the office, your consent must be obtained to participate. Your consent will be active for this visit and any virtual visit you may have with one of our providers in the next 365 days. If you have a MyChart account, a copy of this consent can be sent to you electronically.  As this is a virtual visit, video technology does not allow for your provider to perform a traditional examination. This may limit your provider's ability to fully assess your condition. If your provider identifies any concerns that need to be evaluated in person or the need to arrange testing (such as labs, EKG, etc.), we will make arrangements to do so. Although advances in technology are sophisticated, we cannot ensure that it will always work on either your end or our end. If the connection with a video visit is poor, the visit may have to be switched to a telephone visit. With either a video or telephone visit, we are not always able to ensure that we have a secure connection.  By engaging in this virtual visit, you consent to the provision of healthcare and authorize for your insurance to be billed (if applicable) for the services provided during this visit. Depending on your insurance coverage, you may receive a charge related to this service.  I need to obtain your verbal consent now. Are you willing to proceed with your visit today? Timothy James has provided verbal consent on 09/04/2022 for a virtual visit (video or telephone). Timothy James, [place]  Date: 09/04/2022 11:55 AM  Virtual Visit via Video Note   I, Timothy James, connected with  Timothy James  ([id number], 06-01-1996) on 09/04/22 at 11:15 AM EST by a video-enabled telemedicine application and verified that I am speaking with the correct person using two identifiers.  Location: Patient: Virtual Visit Location  Patient: Home Provider: Virtual Visit Location Provider: Home Office   I discussed the limitations of evaluation and management by telemedicine and the availability of in person appointments. The patient expressed understanding and agreed to proceed.    History of Present Illness: Timothy James is a 26 y.o. who identifies as a male who was assigned male at birth, and is being seen today for blood in stool.  Patient is currently being treated for a kidney stone. Went to ER on 09/02/22 - note reviewed. He had baseline constipation prior to this and is now taking oxycodone for his stone and thinks that this is contributing to his constipation. He is eating and drinking well. He did take a dulcolax tablet this AM without relief.  Denies: n/v, poor appetite, fever, chills, severe abdominal pain, malaise  He had some straining this morning to have a BM and had some slight bleeding evident (bright red) in his stool. Has slight abdominal pain near his bladder. He has never had rectal bleeding before. Denies pain to rectum, lightheadedness, dizziness.  HPI: HPI  Problems:  Patient Active Problem List   Diagnosis Date Noted   Anxiety 02/28/2013   Palpitations 02/28/2013    Allergies: No Known Allergies Medications:  Current Outpatient Medications:    fluticasone (FLONASE) 50 MCG/ACT nasal spray, Place 1 spray into both nostrils daily., Disp: 16 g, Rfl: 2   ibuprofen (MOTRIN IB) 200 MG tablet, Take 1 tablet (200 mg total) by mouth every 6 (six) hours as needed., Disp: 30 tablet,  Rfl: 0   ketorolac (TORADOL) 10 MG tablet, Take 1 tablet (10 mg total) by mouth every 8 (eight) hours as needed for severe pain., Disp: 15 tablet, Rfl: 0   levocetirizine (XYZAL) 5 MG tablet, Take 1 tablet (5 mg total) by mouth every evening., Disp: 30 tablet, Rfl: 0   naproxen (NAPROSYN) 500 MG tablet, Take 1 tablet (500 mg total) by mouth 2 (two) times daily with a meal., Disp: 20 tablet, Rfl: 0   ondansetron  (ZOFRAN-ODT) 4 MG disintegrating tablet, Take 1 tablet (4 mg total) by mouth every 8 (eight) hours as needed for up to 5 days., Disp: 15 tablet, Rfl: 0   oxyCODONE-acetaminophen (PERCOCET/ROXICET) 5-325 MG tablet, Take 1 tablet by mouth every 6 (six) hours as needed for up to 3 days., Disp: 12 tablet, Rfl: 0   pantoprazole (PROTONIX) 40 MG tablet, Take 1 tablet (40 mg total) by mouth daily., Disp: 30 tablet, Rfl: 1   sulfamethoxazole-trimethoprim (BACTRIM DS) 800-160 MG tablet, Take 1 tablet by mouth 2 (two) times daily for 7 days., Disp: 14 tablet, Rfl: 0   tamsulosin (FLOMAX) 0.4 MG CAPS capsule, Take 1 capsule (0.4 mg total) by mouth daily., Disp: 7 capsule, Rfl: 0  Observations/Objective: Patient is well-developed, well-nourished in no acute distress.  Resting comfortably  at home.  Head is normocephalic, atraumatic.  No labored breathing.  Speech is clear and coherent with logical content.  Patient is alert and oriented at baseline.   Assessment and Plan: Lower abdominal pain; Rectal bleeding No red flags on discussion -- patient is in NAD  Symptoms are not severe Recommend pushing fluids and starting colace and miralax -- message sent with instructions Low threshold to go to ER if any worsening sx Needs follow-up with PCP for rectal bleeding - encouraged  Follow Up Instructions: I discussed the assessment and treatment plan with the patient. The patient was provided an opportunity to ask questions and all were answered. The patient agreed with the plan and demonstrated an understanding of the instructions.  A copy of instructions were sent to the patient via MyChart unless otherwise noted below.   The patient was advised to call back or seek an in-person evaluation if the symptoms worsen or if the condition fails to improve as anticipated.  Time:  I spent 10-15 minutes with the patient via telehealth technology discussing the above /problems/concerns.    Timothy James, [place]

## 2022-09-04 NOTE — Patient Instructions (Addendum)
Abdominal Pain, Adult Many things can cause belly (abdominal) pain. Most times, belly pain is not dangerous. Many cases of belly pain can be watched and treated at home. Sometimes, though, belly pain is serious. Your doctor will try to find the cause of your belly pain. Follow these instructions at home:  Medicines Take over-the-counter and prescription medicines only as told by your doctor. Do not take medicines that help you poop (laxatives) unless told by your doctor. General instructions Watch your belly pain for any changes. Drink enough fluid to keep your pee (urine) pale yellow. Keep all follow-up visits as told by your doctor. This is important. Contact a doctor if: Your belly pain changes or gets worse. You are not hungry, or you lose weight without trying. You are having trouble pooping (constipated) or have watery poop (diarrhea) for more than 2-3 days. You have pain when you pee or poop. Your belly pain wakes you up at night. Your pain gets worse with meals, after eating, or with certain foods. You are vomiting and cannot keep anything down. You have a fever. You have blood in your pee. Get help right away if: Your pain does not go away as soon as your doctor says it should. You cannot stop vomiting. Your pain is only in areas of your belly, such as the right side or the left lower part of the belly. You have bloody or black poop, or poop that looks like tar. You have very bad pain, cramping, or bloating in your belly. You have signs of not having enough fluid or water in your body (dehydration), such as: Dark pee, very little pee, or no pee. Cracked lips. Dry mouth. Sunken eyes. Sleepiness. Weakness. You have trouble breathing or chest pain. Summary Many cases of belly pain can be watched and treated at home. Watch your belly pain for any changes. Take over-the-counter and prescription medicines only as told by your doctor. Contact a doctor if your belly pain  changes or gets worse. Get help right away if you have very bad pain, cramping, or bloating in your belly. This information is not intended to replace advice given to you by your health care provider. Make sure you discuss any questions you have with your health care provider. Document Revised: 01/22/2019 Document Reviewed: 01/22/2019 Elsevier Patient Education  2023 Elsevier Inc.  

## 2022-09-05 ENCOUNTER — Telehealth: Payer: Self-pay | Admitting: Physician Assistant

## 2022-09-05 DIAGNOSIS — K5903 Drug induced constipation: Secondary | ICD-10-CM

## 2022-09-05 DIAGNOSIS — R195 Other fecal abnormalities: Secondary | ICD-10-CM

## 2022-09-05 NOTE — Patient Instructions (Signed)
Timothy James, thank you for joining Mar Daring, PA-C for today's virtual visit.  While this provider is not your primary care provider (PCP), if your PCP is located in our provider database this encounter information will be shared with them immediately following your visit.   [place] account gives you access to today's visit and all your visits, tests, and labs performed at [facility] " click here if you don't have a [facility] account or go to [email or web address]  Consent: (Patient) Timothy James provided verbal consent for this virtual visit at the beginning of the encounter.  Current Medications:  Current Outpatient Medications:    fluticasone (FLONASE) 50 MCG/ACT nasal spray, Place 1 spray into both nostrils daily., Disp: 16 g, Rfl: 2   ibuprofen (MOTRIN IB) 200 MG tablet, Take 1 tablet (200 mg total) by mouth every 6 (six) hours as needed., Disp: 30 tablet, Rfl: 0   ketorolac (TORADOL) 10 MG tablet, Take 1 tablet (10 mg total) by mouth every 8 (eight) hours as needed for severe pain., Disp: 15 tablet, Rfl: 0   levocetirizine (XYZAL) 5 MG tablet, Take 1 tablet (5 mg total) by mouth every evening., Disp: 30 tablet, Rfl: 0   naproxen (NAPROSYN) 500 MG tablet, Take 1 tablet (500 mg total) by mouth 2 (two) times daily with a meal., Disp: 20 tablet, Rfl: 0   ondansetron (ZOFRAN-ODT) 4 MG disintegrating tablet, Take 1 tablet (4 mg total) by mouth every 8 (eight) hours as needed for up to 5 days., Disp: 15 tablet, Rfl: 0   oxyCODONE-acetaminophen (PERCOCET/ROXICET) 5-325 MG tablet, Take 1 tablet by mouth every 6 (six) hours as needed for up to 3 days., Disp: 12 tablet, Rfl: 0   pantoprazole (PROTONIX) 40 MG tablet, Take 1 tablet (40 mg total) by mouth daily., Disp: 30 tablet, Rfl: 1   sulfamethoxazole-trimethoprim (BACTRIM DS) 800-160 MG tablet, Take 1 tablet by mouth 2 (two) times daily for 7 days., Disp: 14 tablet, Rfl: 0   tamsulosin  (FLOMAX) 0.4 MG CAPS capsule, Take 1 capsule (0.4 mg total) by mouth daily., Disp: 7 capsule, Rfl: 0   Medications ordered in this encounter:  No orders of the defined types were placed in this encounter.    *If you need refills on other medications prior to your next appointment, please contact your pharmacy*  Follow-Up: Call back or seek an in-person evaluation if the symptoms worsen or if the condition fails to improve as anticipated.  [place] [phone]  Other Instructions   Constipation, Adult Constipation is when a person has trouble pooping (having a bowel movement). When you have this condition, you may poop fewer than 3 times a week. Your poop (stool) may also be dry, hard, or bigger than normal. Follow these instructions at home: Eating and drinking  Eat foods that have a lot of fiber, such as: Fresh fruits and vegetables. Whole grains. Beans. Eat less of foods that are low in fiber and high in fat and sugar, such as: [place] fries. Hamburgers. Cookies. Candy. Soda. Drink enough fluid to keep your pee (urine) pale yellow. General instructions Exercise regularly or as told by your doctor. Try to do 150 minutes of exercise each week. Go to the restroom when you feel like you need to poop. Do not hold it in. Take over-the-counter and prescription medicines only as told by your doctor. These include any fiber supplements. When you poop: Do deep breathing while relaxing your  lower belly (abdomen). Relax your pelvic floor. The pelvic floor is a group of muscles that support the rectum, bladder, and intestines (as well as the uterus in women). Watch your condition for any changes. Tell your doctor if you notice any. Keep all follow-up visits as told by your doctor. This is important. Contact a doctor if: You have pain that gets worse. You have a fever. You have not pooped for 4 days. You vomit. You are not hungry. You lose weight. You are  bleeding from the opening of the butt (anus). You have thin, pencil-like poop. Get help right away if: You have a fever, and your symptoms suddenly get worse. You leak poop or have blood in your poop. Your belly feels hard or bigger than normal (bloated). You have very bad belly pain. You feel dizzy or you faint. Summary Constipation is when a person poops fewer than 3 times a week, has trouble pooping, or has poop that is dry, hard, or bigger than normal. Eat foods that have a lot of fiber. Drink enough fluid to keep your pee (urine) pale yellow. Take over-the-counter and prescription medicines only as told by your doctor. These include any fiber supplements. This information is not intended to replace advice given to you by your health care provider. Make sure you discuss any questions you have with your health care provider. Document Revised: 08/01/2019 Document Reviewed: 08/01/2019 Elsevier Patient Education  2023 Elsevier Inc.    If you have been instructed to have an in-person evaluation today at a local Urgent Care facility, please use the link below. It will take you to a list of all of our available [facility], including address, phone number and hours of operation. Please do not delay care.  [place] [facility]  If you or a family member do not have a primary care provider, use the link below to schedule a visit and establish care. When you choose a [place] primary care physician or advanced practice provider, you gain a long-term partner in health. Find a Primary Care Provider  Learn more about [facility]'s in-office and virtual care options:  - Get Care Now

## 2022-09-05 NOTE — Progress Notes (Signed)
Virtual Visit Consent   Timothy James, you are scheduled for a virtual visit with a [facility] provider today. Just as with appointments in the office, your consent must be obtained to participate. Your consent will be active for this visit and any virtual visit you may have with one of our providers in the next 365 days. If you have a MyChart account, a copy of this consent can be sent to you electronically.  As this is a virtual visit, video technology does not allow for your provider to perform a traditional examination. This may limit your provider's ability to fully assess your condition. If your provider identifies any concerns that need to be evaluated in person or the need to arrange testing (such as labs, EKG, etc.), we will make arrangements to do so. Although advances in technology are sophisticated, we cannot ensure that it will always work on either your end or our end. If the connection with a video visit is poor, the visit may have to be switched to a telephone visit. With either a video or telephone visit, we are not always able to ensure that we have a secure connection.  By engaging in this virtual visit, you consent to the provision of healthcare and authorize for your insurance to be billed (if applicable) for the services provided during this visit. Depending on your insurance coverage, you may receive a charge related to this service.  I need to obtain your verbal consent now. Are you willing to proceed with your visit today? [name] has provided verbal consent on 09/05/2022 for a virtual visit (video or telephone). Margaretann Loveless, PA-C  Date: 09/05/2022 1:56 PM  Virtual Visit via Video Note   I, Margaretann Loveless, connected with  Timothy James  ([id number], [birth year]) on 09/05/22 at  1:30 PM EST by a video-enabled telemedicine application and verified that I am speaking with the correct person using two identifiers.  Location: Patient: Virtual Visit  Location Patient: Home Provider: Virtual Visit Location Provider: Home Office   I discussed the limitations of evaluation and management by telemedicine and the availability of in person appointments. The patient expressed understanding and agreed to proceed.    History of Present Illness: Timothy James is a 26 y.o. who identifies as a male who was assigned male at birth, and is being seen today for continued bowel issues. On 09/02/22 he was seen in person for a kidney stone and UTI. He was started on Bactrim, Flomax, and Percocet. Then he was seen virtually on 09/04/22 for constipation and rectal bleeding.   Today, he is still having constipation issues and feelings of incomplete emptying. He had a BM earlier today that was 3 small pellet like stools but were connected with a thin, brown, mucus string between the three. Also, still having small amount of BRB on outside end of stool. He denies any pain, nausea, vomiting. He is unsure if he has passed his kidney stone but he has not required the percocet in the last 2 days. He has increased fluids and tried a stool softener (one) without much relief.   Problems:  Patient Active Problem List   Diagnosis Date Noted   Anxiety 02/28/2013   Palpitations 02/28/2013    Allergies: No Known Allergies Medications:  Current Outpatient Medications:    fluticasone (FLONASE) 50 MCG/ACT nasal spray, Place 1 spray into both nostrils daily., Disp: 16 g, Rfl: 2   ibuprofen (MOTRIN IB) 200 MG tablet, Take 1  tablet (200 mg total) by mouth every 6 (six) hours as needed., Disp: 30 tablet, Rfl: 0   ketorolac (TORADOL) 10 MG tablet, Take 1 tablet (10 mg total) by mouth every 8 (eight) hours as needed for severe pain., Disp: 15 tablet, Rfl: 0   levocetirizine (XYZAL) 5 MG tablet, Take 1 tablet (5 mg total) by mouth every evening., Disp: 30 tablet, Rfl: 0   naproxen (NAPROSYN) 500 MG tablet, Take 1 tablet (500 mg total) by mouth 2 (two) times daily with a meal.,  Disp: 20 tablet, Rfl: 0   ondansetron (ZOFRAN-ODT) 4 MG disintegrating tablet, Take 1 tablet (4 mg total) by mouth every 8 (eight) hours as needed for up to 5 days., Disp: 15 tablet, Rfl: 0   oxyCODONE-acetaminophen (PERCOCET/ROXICET) 5-325 MG tablet, Take 1 tablet by mouth every 6 (six) hours as needed for up to 3 days., Disp: 12 tablet, Rfl: 0   pantoprazole (PROTONIX) 40 MG tablet, Take 1 tablet (40 mg total) by mouth daily., Disp: 30 tablet, Rfl: 1   sulfamethoxazole-trimethoprim (BACTRIM DS) 800-160 MG tablet, Take 1 tablet by mouth 2 (two) times daily for 7 days., Disp: 14 tablet, Rfl: 0   tamsulosin (FLOMAX) 0.4 MG CAPS capsule, Take 1 capsule (0.4 mg total) by mouth daily., Disp: 7 capsule, Rfl: 0  Observations/Objective: Patient is well-developed, well-nourished in no acute distress.  Resting comfortably at home.  Head is normocephalic, atraumatic.  No labored breathing.  Speech is clear and coherent with logical content.  Patient is alert and oriented at baseline.    Assessment and Plan: 1. Drug-induced constipation  2. Change in stool  - Suspect constipation from recent narcotic pain medication for kidney stone - Advised to continue to push fluids - Add Miralax - Continue stool softener, but increase to 1 capsule every 6-8 hours - Limit Percocet use if able - Follow up in person if stomach pain increases, rectal bleeding increases, or if bowel changes do not return to normal over the next 2-3 days.  Follow Up Instructions: I discussed the assessment and treatment plan with the patient. The patient was provided an opportunity to ask questions and all were answered. The patient agreed with the plan and demonstrated an understanding of the instructions.  A copy of instructions were sent to the patient via MyChart unless otherwise noted below.    The patient was advised to call back or seek an in-person evaluation if the symptoms worsen or if the condition fails to improve as  anticipated.  Time:  I spent 15 minutes with the patient via telehealth technology discussing the above problems/concerns.    Margaretann Loveless, PA-C

## 2022-09-09 ENCOUNTER — Encounter: Payer: Self-pay | Admitting: *Deleted

## 2022-09-14 ENCOUNTER — Other Ambulatory Visit: Payer: Self-pay

## 2022-09-14 ENCOUNTER — Emergency Department
Admission: EM | Admit: 2022-09-14 | Discharge: 2022-09-14 | Disposition: A | Discharge status: Home / Self Care | Payer: Self-pay | Attending: Emergency Medicine | Admitting: Emergency Medicine

## 2022-09-14 DIAGNOSIS — K625 Hemorrhage of anus and rectum: Secondary | ICD-10-CM | POA: Insufficient documentation

## 2022-09-14 LAB — URINALYSIS, ROUTINE W REFLEX MICROSCOPIC
Bacteria, UA: NONE SEEN
Bilirubin Urine: NEGATIVE
Glucose, UA: NEGATIVE mg/dL
Ketones, ur: NEGATIVE mg/dL
Leukocytes,Ua: NEGATIVE
Nitrite: NEGATIVE
Protein, ur: NEGATIVE mg/dL
Specific Gravity, Urine: 1.006 (ref 1.005–1.030)
Squamous Epithelial / HPF: NONE SEEN (ref 0–5)
pH: 8 (ref 5.0–8.0)

## 2022-09-14 LAB — COMPREHENSIVE METABOLIC PANEL
ALT: 20 U/L (ref 0–44)
AST: 20 U/L (ref 15–41)
Albumin: 4.6 g/dL (ref 3.5–5.0)
Alkaline Phosphatase: 68 U/L (ref 38–126)
Anion gap: 9 (ref 5–15)
BUN: 10 mg/dL (ref 6–20)
CO2: 24 mmol/L (ref 22–32)
Calcium: 9.4 mg/dL (ref 8.9–10.3)
Chloride: 107 mmol/L (ref 98–111)
Creatinine, Ser: 0.67 mg/dL (ref 0.61–1.24)
GFR, Estimated: >60 mL/min (ref >60)
Glucose, Bld: 100 mg/dL — ABNORMAL HIGH (ref 70–99)
Potassium: 3.9 mmol/L (ref 3.5–5.1)
Sodium: 140 mmol/L (ref 135–145)
Total Bilirubin: 1 mg/dL (ref 0.3–1.2)
Total Protein: 7.5 g/dL (ref 6.5–8.1)

## 2022-09-14 LAB — CBC WITH DIFFERENTIAL/PLATELET
Abs Immature Granulocytes: 0.05 10*3/uL (ref 0.00–0.07)
Basophils Absolute: 0 10*3/uL (ref 0.0–0.1)
Basophils Relative: 0 %
Eosinophils Absolute: 0.1 10*3/uL (ref 0.0–0.5)
Eosinophils Relative: 1 %
HCT: 45.3 % (ref 39.0–52.0)
Hemoglobin: 15.4 g/dL (ref 13.0–17.0)
Immature Granulocytes: 0 %
Lymphocytes Relative: 24 %
Lymphs Abs: 2.7 10*3/uL (ref 0.7–4.0)
MCH: 29.4 pg (ref 26.0–34.0)
MCHC: 34 g/dL (ref 30.0–36.0)
MCV: 86.6 fL (ref 80.0–100.0)
Monocytes Absolute: 0.6 10*3/uL (ref 0.1–1.0)
Monocytes Relative: 6 %
Neutro Abs: 7.7 10*3/uL (ref 1.7–7.7)
Neutrophils Relative %: 69 %
Platelets: 347 10*3/uL (ref 150–400)
RBC: 5.23 MIL/uL (ref 4.22–5.81)
RDW: 11.7 % (ref 11.5–15.5)
WBC: 11.2 10*3/uL — ABNORMAL HIGH (ref 4.0–10.5)
nRBC: 0 % (ref 0.0–0.2)

## 2022-09-14 LAB — LIPASE, BLOOD: Lipase: 33 U/L (ref 11–51)

## 2022-09-14 MED ORDER — DOCUSATE SODIUM 100 MG PO CAPS: 100.0000 mg | ORAL_CAPSULE | Freq: Every day | ORAL | 0 refills | Status: AC

## 2022-09-14 MED ORDER — HYDROCORTISONE ACETATE 25 MG RE SUPP: 25.0000 mg | Freq: Two times a day (BID) | RECTAL | 0 refills | Status: AC

## 2022-09-14 MED ORDER — DICYCLOMINE HCL 20 MG PO TABS: 20.0000 mg | ORAL_TABLET | Freq: Three times a day (TID) | ORAL | 0 refills | Status: DC | PRN

## 2022-09-14 NOTE — ED Provider Triage Note (Signed)
Emergency Medicine Provider Triage Evaluation Note  Timothy James , a 26 y.o. male  was evaluated in triage.  Pt complains of left sided flank pain and epigastric pain x1.5 weeks. Also reports constipation. No N/V/D. Reports his stomach is "bubbling."  Review of Systems  Positive: Abd pain Negative: N/v/d  Physical Exam  There were no vitals taken for this visit. Gen:   Awake, no distress   Resp:  Normal effort  MSK:   Moves extremities without difficulty Other:    Medical Decision Making  Medically screening exam initiated at 3:59 PM.  Appropriate orders placed.  Timothy James was informed that the remainder of the evaluation will be completed by another provider, this initial triage assessment does not replace that evaluation, and the importance of remaining in the ED until their evaluation is complete.     Timothy Hoehn, PA-C 09/14/22 1601

## 2022-09-14 NOTE — ED Triage Notes (Signed)
Pt comes in today with complaints of left sided pain, and lower abdominal pain off and on for the past week.  Pt states that he noticed a little blood in his stool today, which made him come in today. Pt was seen recently for a kidney stone and treated with antibiotics. Pt isn't currently taking any medications at this point.

## 2022-09-14 NOTE — ED Provider Notes (Signed)
[facility] Provider Note    Event Date/Time   First MD Initiated Contact with Patient 09/14/22 1711     (approximate)   History   Abdominal Pain   HPI  Timothy James is a 26 y.o. male here with red blood in stool.  The patient states that he had a firm bowel movement today.  He states he strained.  He has had issues with constipation and intermittent abdominal cramping since he was recently diagnosed with both a viral GI syndrome as well as kidney stone.  He is on antibiotics for this.  He states after the brown bowel movement today he strained and then felt a small amount of blood that came out at the very end of the stool.  This was not mixed in with the stool.  He had some bleeding like this previously after his GI virus and when he was taking his pain medication so he presents for evaluation.  No history of Crohn's or ulcerative colitis.  No blood thinner use.  No shortness of breath or lightheadedness.  No significant rectal pain.     Physical Exam   Triage Vital Signs: ED Triage Vitals  Enc Vitals Group     BP 09/14/22 1603 (!) 126/98     Pulse Rate 09/14/22 1603 98     Resp 09/14/22 1603 16     Temp 09/14/22 1603 99.1 F (37.3 C)     Temp Source 09/14/22 1603 Oral     SpO2 09/14/22 1603 97 %     Weight 09/14/22 1604 141 lb 8 oz (64.2 kg)     Height 09/14/22 1604 6\' 1"  (1.854 m)     Head Circumference --      Peak Flow --      Pain Score 09/14/22 1603 5     Pain Loc --      Pain Edu? --      Excl. in [place]? --     Most recent vital signs: Vitals:   09/14/22 1603  BP: (!) 126/98  Pulse: 98  Resp: 16  Temp: 99.1 F (37.3 C)  SpO2: 97%     General: Awake, no distress.  CV:  Good peripheral perfusion.  Regular rate and rhythm.  No murmurs. Resp:  Normal effort.  Abd:  No distention.  No tenderness.  No rebound or guarding.  On external exam, no evidence of external hemorrhoids.  No bleeding. Other:  Moist mucous membranes.   ED  Results / Procedures / Treatments   Labs (all labs ordered are listed, but only abnormal results are displayed) Labs Reviewed  COMPREHENSIVE METABOLIC PANEL - Abnormal; Notable for the following components:      Result Value   Glucose, Bld 100 (*)    All other components within normal limits  CBC WITH DIFFERENTIAL/PLATELET - Abnormal; Notable for the following components:   WBC 11.2 (*)    All other components within normal limits  URINALYSIS, ROUTINE W REFLEX MICROSCOPIC - Abnormal; Notable for the following components:   Color, Urine STRAW (*)    APPearance CLEAR (*)    Hgb urine dipstick SMALL (*)    All other components within normal limits  LIPASE, BLOOD     EKG    RADIOLOGY Reviewed CT from previous visit for flank pain which shows no evidence of diverticulosis or other bowel issues   I also independently reviewed and agree with radiologist interpretations.   PROCEDURES:  Critical Care performed: No  MEDICATIONS ORDERED IN ED: Medications - No data to display   IMPRESSION / MDM / ASSESSMENT AND PLAN / ED COURSE  I reviewed the triage vital signs and the nursing notes.                              Differential diagnosis includes, but is not limited to, hemorrhoid, internal and external, colitis, anal fissure, diverticulosis, mucosal irritation from recent GI illness and constipation  Patient's presentation is most consistent with acute presentation with potential threat to life or bodily function.  26 year old male here with small amount of bright red blood per rectum after a soft brown bowel movement.  Clinically I suspect internal hemorrhoids with scant bleeding in the setting of recent ileus and constipation due to his kidney stone.  He has no evidence of ongoing bleeding.  Hemoglobin is stable.  He is hemodynamically stable.  CMP is unremarkable.  Urinalysis shows some mild hematuria which I suspect is related to his recent kidney stone.  No ongoing flank  pain.  No history or signs to suggest Crohn's or ulcerative colitis.  He is not on blood thinners.  I reviewed his previous CT scan which showed no evidence of mass, diverticulosis, or other emergent pathology.  Will advise stool softeners, Anusol, and outpatient follow-up.  FINAL CLINICAL IMPRESSION(S) / ED DIAGNOSES   Final diagnoses:  Rectal bleeding     Rx / DC Orders   ED Discharge Orders          Ordered    docusate sodium (COLACE) 100 MG capsule  Daily        09/14/22 1904    hydrocortisone (ANUSOL-HC) 25 MG suppository  Every 12 hours        09/14/22 1904    dicyclomine (BENTYL) 20 MG tablet  3 times daily PRN        09/14/22 1904             Note:  This document was prepared using Dragon voice recognition software and may include unintentional dictation errors.   Shaune Pollack, MD 09/14/22 Ebony Cargo

## 2022-09-27 DIAGNOSIS — N289 Disorder of kidney and ureter, unspecified: Secondary | ICD-10-CM

## 2022-09-27 HISTORY — DX: Disorder of kidney and ureter, unspecified: N28.9

## 2022-09-30 ENCOUNTER — Encounter: Payer: Self-pay | Admitting: Emergency Medicine

## 2022-09-30 ENCOUNTER — Other Ambulatory Visit: Payer: Self-pay

## 2022-09-30 ENCOUNTER — Emergency Department
Admission: EM | Admit: 2022-09-30 | Discharge: 2022-09-30 | Disposition: A | Discharge status: Home / Self Care | Payer: Self-pay | Attending: Emergency Medicine | Admitting: Emergency Medicine

## 2022-09-30 ENCOUNTER — Emergency Department: Payer: Self-pay

## 2022-09-30 DIAGNOSIS — N200 Calculus of kidney: Secondary | ICD-10-CM | POA: Insufficient documentation

## 2022-09-30 LAB — CBC WITH DIFFERENTIAL/PLATELET
Abs Immature Granulocytes: 0.05 10*3/uL (ref 0.00–0.07)
Basophils Absolute: 0 10*3/uL (ref 0.0–0.1)
Basophils Relative: 1 %
Eosinophils Absolute: 0.2 10*3/uL (ref 0.0–0.5)
Eosinophils Relative: 2 %
HCT: 45.5 % (ref 39.0–52.0)
Hemoglobin: 15 g/dL (ref 13.0–17.0)
Immature Granulocytes: 1 %
Lymphocytes Relative: 31 %
Lymphs Abs: 2.6 10*3/uL (ref 0.7–4.0)
MCH: 29.2 pg (ref 26.0–34.0)
MCHC: 33 g/dL (ref 30.0–36.0)
MCV: 88.7 fL (ref 80.0–100.0)
Monocytes Absolute: 0.6 10*3/uL (ref 0.1–1.0)
Monocytes Relative: 8 %
Neutro Abs: 4.8 10*3/uL (ref 1.7–7.7)
Neutrophils Relative %: 57 %
Platelets: 297 10*3/uL (ref 150–400)
RBC: 5.13 MIL/uL (ref 4.22–5.81)
RDW: 11.8 % (ref 11.5–15.5)
WBC: 8.3 10*3/uL (ref 4.0–10.5)
nRBC: 0 % (ref 0.0–0.2)

## 2022-09-30 LAB — URINALYSIS, ROUTINE W REFLEX MICROSCOPIC
Bacteria, UA: NONE SEEN
Bilirubin Urine: NEGATIVE
Glucose, UA: NEGATIVE mg/dL
Ketones, ur: NEGATIVE mg/dL
Leukocytes,Ua: NEGATIVE
Nitrite: NEGATIVE
Protein, ur: NEGATIVE mg/dL
Specific Gravity, Urine: 1.009 (ref 1.005–1.030)
pH: 5 (ref 5.0–8.0)

## 2022-09-30 LAB — BASIC METABOLIC PANEL
Anion gap: 9 (ref 5–15)
BUN: 10 mg/dL (ref 6–20)
CO2: 25 mmol/L (ref 22–32)
Calcium: 9.4 mg/dL (ref 8.9–10.3)
Chloride: 103 mmol/L (ref 98–111)
Creatinine, Ser: 0.82 mg/dL (ref 0.61–1.24)
GFR, Estimated: >60 mL/min (ref >60)
Glucose, Bld: 104 mg/dL — ABNORMAL HIGH (ref 70–99)
Potassium: 4.2 mmol/L (ref 3.5–5.1)
Sodium: 137 mmol/L (ref 135–145)

## 2022-09-30 MED ORDER — TAMSULOSIN HCL 0.4 MG PO CAPS: 0.4000 mg | ORAL_CAPSULE | Freq: Every day | ORAL | 0 refills | Status: AC

## 2022-09-30 NOTE — Discharge Instructions (Addendum)
You may take the Flomax to help the passage of your kidney stone.  Please arrange follow-up with urology.  Please return for any new, worsening, or change in symptoms or other concerns.

## 2022-09-30 NOTE — ED Provider Notes (Signed)
[facility] Provider Note    Event Date/Time   First MD Initiated Contact with Patient 09/30/22 1256     (approximate)   History   UTI   HPI  Timothy James is a 27 y.o. male with a past medical history of kidney stones presents today with left lower quadrant and suprapubic discomfort.  He also has mild discomfort with urination at the end of his stream.  He denies flank pain.  He noticed blood in his urine 2 days ago but this has resolved.  He reports that he is sexually active with 1 partner and they use protection, he is not worried about sexually transmitted diseases.  He denies nausea or vomiting.  No fevers or chills.  Patient Active Problem List   Diagnosis Date Noted   Anxiety 02/28/2013   Palpitations 02/28/2013          Physical Exam   Triage Vital Signs: ED Triage Vitals  Enc Vitals Group     BP 09/30/22 1246 (!) 144/94     Pulse Rate 09/30/22 1246 83     Resp 09/30/22 1246 18     Temp 09/30/22 1246 98 F (36.7 C)     Temp src --      SpO2 09/30/22 1246 98 %     Weight --      Height --      Head Circumference --      Peak Flow --      Pain Score 09/30/22 1238 4     Pain Loc --      Pain Edu? --      Excl. in [place]? --     Most recent vital signs: Vitals:   09/30/22 1246  BP: (!) 144/94  Pulse: 83  Resp: 18  Temp: 98 F (36.7 C)  SpO2: 98%    Physical Exam Vitals and nursing note reviewed.  Constitutional:      General: Awake and alert. No acute distress.    Appearance: Normal appearance. The patient is normal weight.  HENT:     Head: Normocephalic and atraumatic.     Mouth: Mucous membranes are moist.  Eyes:     General: PERRL. Normal EOMs        Right eye: No discharge.        Left eye: No discharge.     Conjunctiva/sclera: Conjunctivae normal.  Cardiovascular:     Rate and Rhythm: Normal rate and regular rhythm.     Pulses: Normal pulses.  Pulmonary:     Effort: Pulmonary effort is normal. No  respiratory distress.     Breath sounds: Normal breath sounds.  Abdominal:     Abdomen is soft. There is left lower quadrant and suprapubic abdominal tenderness. No rebound or guarding. No distention.  No CVA tenderness Musculoskeletal:        General: No swelling. Normal range of motion.     Cervical back: Normal range of motion and neck supple.  Skin:    General: Skin is warm and dry.     Capillary Refill: Capillary refill takes less than 2 seconds.     Findings: No rash.  Neurological:     Mental Status: The patient is awake and alert.      ED Results / Procedures / Treatments   Labs (all labs ordered are listed, but only abnormal results are displayed) Labs Reviewed  URINALYSIS, ROUTINE W REFLEX MICROSCOPIC - Abnormal; Notable for the following components:  Result Value   Color, Urine YELLOW (*)    APPearance CLEAR (*)    Hgb urine dipstick MODERATE (*)    All other components within normal limits  BASIC METABOLIC PANEL - Abnormal; Notable for the following components:   Glucose, Bld 104 (*)    All other components within normal limits  CHLAMYDIA/NGC RT PCR ([facility] ONLY)            CBC WITH DIFFERENTIAL/PLATELET     EKG     RADIOLOGY I independently reviewed and interpreted imaging and agree with radiologists findings.     PROCEDURES:  Critical Care performed:   Procedures   MEDICATIONS ORDERED IN ED: Medications - No data to display   IMPRESSION / MDM / [place] / ED COURSE  I reviewed the triage vital signs and the nursing notes.   Differential diagnosis includes, but is not limited to, nephrolithiasis, cystitis, pyelonephritis, infected stone, sexually transmitted disease.  Patient is awake and alert, hemodynamically stable and afebrile.  He agreed to further workup.  Blood work is overall reassuring.  Urinalysis reveals blood without evidence of infection.  CT scan reveals a 5 x 2 mm stone at the left UVJ.  Discussed these findings  with the patient.  He declined analgesia.  He was offered Flomax which he accepted.  I recommended close outpatient follow-up with urology.  We discussed return precautions.  Patient understands and agrees with plan.  He was discharged in stable condition.   Patient's presentation is most consistent with acute complicated illness / injury requiring diagnostic workup.      FINAL CLINICAL IMPRESSION(S) / ED DIAGNOSES   Final diagnoses:  Kidney stone     Rx / DC Orders   ED Discharge Orders          Ordered    tamsulosin (FLOMAX) 0.4 MG CAPS capsule  Daily        09/30/22 1336             Note:  This document was prepared using Dragon voice recognition software and may include unintentional dictation errors.   [name] 09/30/22 1357    Vanessa [place], MD 10/02/22 [phone]

## 2022-09-30 NOTE — ED Triage Notes (Signed)
Pt comes with c/o possible UTI. Pt states pain and burning with urination.

## 2022-09-30 NOTE — ED Notes (Signed)
Reports discomfort when urinating

## 2022-10-01 LAB — CHLAMYDIA/NGC RT PCR (ARMC ONLY)
Chlamydia Tr: NOT DETECTED
N gonorrhoeae: NOT DETECTED

## 2022-11-07 ENCOUNTER — Encounter: Payer: Self-pay | Admitting: Physician Assistant

## 2022-11-07 ENCOUNTER — Telehealth: Payer: Self-pay | Admitting: Nurse Practitioner

## 2022-11-07 DIAGNOSIS — J069 Acute upper respiratory infection, unspecified: Secondary | ICD-10-CM

## 2022-11-07 DIAGNOSIS — M25512 Pain in left shoulder: Secondary | ICD-10-CM

## 2022-11-07 MED ORDER — METHOCARBAMOL 500 MG PO TABS: 500.0000 mg | ORAL_TABLET | Freq: Three times a day (TID) | ORAL | 0 refills | Status: DC | PRN

## 2022-11-07 MED ORDER — BENZONATATE 100 MG PO CAPS: 100.0000 mg | ORAL_CAPSULE | Freq: Two times a day (BID) | ORAL | 0 refills | Status: DC | PRN

## 2022-11-07 MED ORDER — IBUPROFEN 600 MG PO TABS: 600.0000 mg | ORAL_TABLET | Freq: Three times a day (TID) | ORAL | 0 refills | Status: DC | PRN

## 2022-11-07 MED ORDER — FLUTICASONE PROPIONATE 50 MCG/ACT NA SUSP: 1.0000 | Freq: Every day | NASAL | 2 refills | Status: DC

## 2022-11-07 NOTE — Progress Notes (Signed)
Virtual Visit Consent   Timothy James, you are scheduled for a virtual visit with a [facility] provider today. Just as with appointments in the office, your consent must be obtained to participate. Your consent will be active for this visit and any virtual visit you may have with one of our providers in the next 365 days. If you have a MyChart account, a copy of this consent can be sent to you electronically.  As this is a virtual visit, video technology does not allow for your provider to perform a traditional examination. This may limit your provider's ability to fully assess your condition. If your provider identifies any concerns that need to be evaluated in person or the need to arrange testing (such as labs, EKG, etc.), we will make arrangements to do so. Although advances in technology are sophisticated, we cannot ensure that it will always work on either your end or our end. If the connection with a video visit is poor, the visit may have to be switched to a telephone visit. With either a video or telephone visit, we are not always able to ensure that we have a secure connection.  By engaging in this virtual visit, you consent to the provision of healthcare and authorize for your insurance to be billed (if applicable) for the services provided during this visit. Depending on your insurance coverage, you may receive a charge related to this service.  I need to obtain your verbal consent now. Are you willing to proceed with your visit today? Timothy James has provided verbal consent on 11/07/2022 for a virtual visit (video or telephone). Timothy Pounds, NP  Date: 11/07/2022 1:00 PM  Virtual Visit via Video Note   I, Timothy James, connected with  Timothy James  ([id number], [birth year]) on 11/07/22 at 12:30 PM EST by a video-enabled telemedicine application and verified that I am speaking with the correct person using two identifiers.  Location: Patient: Virtual Visit Location  Patient: Home Provider: Virtual Visit Location Provider: Home Office   I discussed the limitations of evaluation and management by telemedicine and the availability of in person appointments. The patient expressed understanding and agreed to proceed.    History of Present Illness: Timothy James is a 27 y.o. who identifies as a male who was assigned male at birth, and is being seen today for musculoskeletal pain  Timothy James endorses palpable reproducible pain below the left clavicle.  Does not endorse any injury or trauma to this area.  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  Tylenol has not been completely relieving the pain.  Associated symptoms include tightness in chest when taking a deep breath.  Pain is not present when he lies down.   Problems:  Patient Active Problem List   Diagnosis Date Noted   Anxiety 02/28/2013   Palpitations 02/28/2013    Allergies: No Known Allergies Medications:  Current Outpatient Medications:    ibuprofen (ADVIL) 600 MG tablet, Take 1 tablet (600 mg total) by mouth every 8 (eight) hours as needed., Disp: 30 tablet, Rfl: 0   methocarbamol (ROBAXIN) 500 MG tablet, Take 1 tablet (500 mg total) by mouth every 8 (eight) hours as needed for muscle spasms., Disp: 30 tablet, Rfl: 0   benzonatate (TESSALON) 100 MG capsule, Take 1-2 capsules (100-200 mg total) by mouth 2 (two) times daily as needed for cough., Disp: 30 capsule, Rfl: 0   dicyclomine (BENTYL) 20 MG tablet, Take 1 tablet (20 mg total)  by mouth 3 (three) times daily as needed for up to 10 days for spasms (muscle cramps/spasms)., Disp: 30 tablet, Rfl: 0   fluticasone (FLONASE) 50 MCG/ACT nasal spray, Place 1 spray into both nostrils daily., Disp: 16 g, Rfl: 2   levocetirizine (XYZAL) 5 MG tablet, Take 1 tablet (5 mg total) by mouth every evening., Disp: 30 tablet, Rfl: 0   naproxen (NAPROSYN) 500 MG tablet, Take 1 tablet (500 mg total) by mouth 2 (two) times daily with a meal., Disp: 20 tablet,  Rfl: 0   pantoprazole (PROTONIX) 40 MG tablet, Take 1 tablet (40 mg total) by mouth daily., Disp: 30 tablet, Rfl: 1   tamsulosin (FLOMAX) 0.4 MG CAPS capsule, Take 1 capsule (0.4 mg total) by mouth daily., Disp: 7 capsule, Rfl: 0  Observations/Objective: Patient is well-developed, well-nourished in no acute distress.  Resting comfortably at home.  Head is normocephalic, atraumatic.  No labored breathing.  Speech is clear and coherent with logical content.  Patient is alert and oriented at baseline.    Assessment and Plan: 1. Acute pain of left shoulder - methocarbamol (ROBAXIN) 500 MG tablet; Take 1 tablet (500 mg total) by mouth every 8 (eight) hours as needed for muscle spasms.  Dispense: 30 tablet; Refill: 0 - ibuprofen (ADVIL) 600 MG tablet; Take 1 tablet (600 mg total) by mouth every 8 (eight) hours as needed.  Dispense: 30 tablet; Refill: 0 May apply warm compresses to the affected area as needed.  Follow Up Instructions: I discussed the assessment and treatment plan with the patient. The patient was provided an opportunity to ask questions and all were answered. The patient agreed with the plan and demonstrated an understanding of the instructions.  A copy of instructions were sent to the patient via MyChart unless otherwise noted below.    The patient was advised to call back or seek an in-person evaluation if the symptoms worsen or if the condition fails to improve as anticipated.  Time:  I spent 12 minutes with the patient via telehealth technology discussing the above problems/concerns.    Timothy Pounds, NP

## 2022-11-07 NOTE — Progress Notes (Signed)
I have spent 5 minutes in review of e-visit questionnaire, review and updating patient chart, medical decision making and response to patient.  ° °Aasim Restivo W Jibri Schriefer, NP ° °  °

## 2022-11-07 NOTE — Progress Notes (Signed)

## 2022-11-07 NOTE — Patient Instructions (Signed)
Timothy James, thank you for joining Gildardo Pounds, NP for today's virtual visit.  While this provider is not your primary care provider (PCP), if your PCP is located in our provider database this encounter information will be shared with them immediately following your visit.   [facility] account gives you access to today's visit and all your visits, tests, and labs performed at [facility] " click here if you don't have a [facility] account or go to [email or web address]  Consent: (Patient) Timothy James provided verbal consent for this virtual visit at the beginning of the encounter.  Current Medications:  Current Outpatient Medications:    ibuprofen (ADVIL) 600 MG tablet, Take 1 tablet (600 mg total) by mouth every 8 (eight) hours as needed., Disp: 30 tablet, Rfl: 0   methocarbamol (ROBAXIN) 500 MG tablet, Take 1 tablet (500 mg total) by mouth every 8 (eight) hours as needed for muscle spasms., Disp: 30 tablet, Rfl: 0   benzonatate (TESSALON) 100 MG capsule, Take 1-2 capsules (100-200 mg total) by mouth 2 (two) times daily as needed for cough., Disp: 30 capsule, Rfl: 0   dicyclomine (BENTYL) 20 MG tablet, Take 1 tablet (20 mg total) by mouth 3 (three) times daily as needed for up to 10 days for spasms (muscle cramps/spasms)., Disp: 30 tablet, Rfl: 0   fluticasone (FLONASE) 50 MCG/ACT nasal spray, Place 1 spray into both nostrils daily., Disp: 16 g, Rfl: 2   levocetirizine (XYZAL) 5 MG tablet, Take 1 tablet (5 mg total) by mouth every evening., Disp: 30 tablet, Rfl: 0   naproxen (NAPROSYN) 500 MG tablet, Take 1 tablet (500 mg total) by mouth 2 (two) times daily with a meal., Disp: 20 tablet, Rfl: 0   pantoprazole (PROTONIX) 40 MG tablet, Take 1 tablet (40 mg total) by mouth daily., Disp: 30 tablet, Rfl: 1   tamsulosin (FLOMAX) 0.4 MG CAPS capsule, Take 1 capsule (0.4 mg total) by mouth daily., Disp: 7 capsule, Rfl: 0   Medications ordered in this  encounter:  Meds ordered this encounter  Medications   methocarbamol (ROBAXIN) 500 MG tablet    Sig: Take 1 tablet (500 mg total) by mouth every 8 (eight) hours as needed for muscle spasms.    Dispense:  30 tablet    Refill:  0    Order Specific Question:   Supervising Provider    Answer:   Chase Picket [id number]   ibuprofen (ADVIL) 600 MG tablet    Sig: Take 1 tablet (600 mg total) by mouth every 8 (eight) hours as needed.    Dispense:  30 tablet    Refill:  0    Order Specific Question:   Supervising Provider    Answer:   Chase Picket [id number]     *If you need refills on other medications prior to your next appointment, please contact your pharmacy*  Follow-Up: Call back or seek an in-person evaluation if the symptoms worsen or if the condition fails to improve as anticipated.  [place] [phone]  Other Instructions May apply warm compresses to the affected area as needed   If you have been instructed to have an in-person evaluation today at a local Urgent Care facility, please use the link below. It will take you to a list of all of our available [facility], including address, phone number and hours of operation. Please do not delay care.  [facility]  If  you or a family member do not have a primary care provider, use the link below to schedule a visit and establish care. When you choose a [facility] primary care physician or advanced practice provider, you gain a long-term partner in health. Find a Primary Care Provider  Learn more about [facility] in-office and virtual care options: [facility]

## 2023-04-19 ENCOUNTER — Telehealth: Payer: Self-pay | Admitting: Physician Assistant

## 2023-04-19 DIAGNOSIS — K5900 Constipation, unspecified: Secondary | ICD-10-CM

## 2023-04-19 NOTE — Progress Notes (Signed)
I am actually concerned you are dealing with a bit of constipation. As such, I encourage you to increase hydration and the amount of fiber in your diet.  Start a daily probiotic (Align, Culturelle, Digestive Advantage, etc.). If no bowel movement within 24 hours, take 2 Tbs of Milk of Magnesia in a 4 oz glass of warmed prune juice every 2-3 days to help promote full bowel movement. If no results within 24 hours, then repeat above regimen, adding a Dulcolax stool softener to regimen. If no improvement or any increased pain, you need an in-person evaluation ASAP.

## 2023-04-19 NOTE — Progress Notes (Signed)
I have spent 5 minutes in review of e-visit questionnaire, review and updating patient chart, medical decision making and response to patient.   William Cody Martin, PA-C    

## 2023-04-19 NOTE — Progress Notes (Signed)
Message sent to patient requesting further input regarding current symptoms. Awaiting patient response.  

## 2023-04-21 ENCOUNTER — Telehealth: Payer: Self-pay | Admitting: Nurse Practitioner

## 2023-04-21 DIAGNOSIS — K5900 Constipation, unspecified: Secondary | ICD-10-CM

## 2023-04-21 DIAGNOSIS — K219 Gastro-esophageal reflux disease without esophagitis: Secondary | ICD-10-CM

## 2023-04-21 MED ORDER — OMEPRAZOLE 20 MG PO CPDR: 20.0000 mg | DELAYED_RELEASE_CAPSULE | Freq: Every day | ORAL | 0 refills | Status: DC

## 2023-04-21 MED ORDER — FAMOTIDINE 20 MG PO TABS: 20.0000 mg | ORAL_TABLET | Freq: Two times a day (BID) | ORAL | 0 refills | Status: DC

## 2023-04-21 NOTE — Progress Notes (Signed)
   Thank you for the details you included in the comment boxes. Those details are very helpful in determining the best course of treatment for you and help [place] to provide the best care. We recommend that you convert this visit to a video visit in order for the provider to better assess what is going on.  The provider will be able to give you a more accurate diagnosis and treatment plan if we can more freely discuss your symptoms and with the addition of a virtual examination.   If you convert to a video visit, we will bill your insurance (similar to an office visit) and you will not be charged for this e-Visit. You will be able to stay at home and speak with the first available [facility] advanced practice provider. The link to do a video visit is in the drop down Menu tab of your Welcome screen in MyChart.

## 2023-04-21 NOTE — Progress Notes (Signed)
Virtual Visit Consent   Timothy James, you are scheduled for a virtual visit with a [facility] provider today. Just as with appointments in the office, your consent must be obtained to participate. Your consent will be active for this visit and any virtual visit you may have with one of our providers in the next 365 days. If you have a MyChart account, a copy of this consent can be sent to you electronically.  As this is a virtual visit, video technology does not allow for your provider to perform a traditional examination. This may limit your provider's ability to fully assess your condition. If your provider identifies any concerns that need to be evaluated in person or the need to arrange testing (such as labs, EKG, etc.), we will make arrangements to do so. Although advances in technology are sophisticated, we cannot ensure that it will always work on either your end or our end. If the connection with a video visit is poor, the visit may have to be switched to a telephone visit. With either a video or telephone visit, we are not always able to ensure that we have a secure connection.  By engaging in this virtual visit, you consent to the provision of healthcare and authorize for your insurance to be billed (if applicable) for the services provided during this visit. Depending on your insurance coverage, you may receive a charge related to this service.  I need to obtain your verbal consent now. Are you willing to proceed with your visit today? Timothy James has provided verbal consent on 04/21/2023 for a virtual visit (video or telephone). Timothy Simas, FNP  Date: 04/21/2023 2:25 PM  Virtual Visit via Video Note   I, Timothy James, connected with  Timothy James  ([id number], 1996-06-11) on 04/21/23 at  2:30 PM EDT by a video-enabled telemedicine application and verified that I am speaking with the correct person using two identifiers.  Location: Patient: Virtual Visit Location  Patient: Home Provider: Virtual Visit Location Provider: Home Office   I discussed the limitations of evaluation and management by telemedicine and the availability of in person appointments. The patient expressed understanding and agreed to proceed.    History of Present Illness: Timothy James is a 27 y.o. who identifies as a male who was assigned male at birth, and is being seen today for one week of stomach pain  History significant for GERD has been on Protonix in the past   Symptom onset  after eating ghost peppers that caused acute digestive pains that came and left  He was then OK and having normal bowel movements   4 days ago he started to have some lower abdominal discomfort, a fullness sensation to her lower abdomen, he was having some bloating that has improved today   He has been able to eat and drink normally   He had diarrhea twice today  For the past week he had normal bowel movements daily until the past 48 hours when he had the episodes of diarrhea   He has been sneezing with a slight cough in the past 48 hours Denies fevers  No weakness No vomiting  No urge to vomit   Did note his stools were darker green today  No blood in stools   He has had some epigastric burning   He has not taken any stool softeners or laxatives  Felt he had a good release yesterday   His abdominal discomfort is umbilical region doe snot  radiate to RLQ or back  More discomfort in epigastric region when he eats    Problems:  Patient Active Problem List   Diagnosis Date Noted   Anxiety 02/28/2013   Palpitations 02/28/2013    Allergies: No Known Allergies Medications:  Current Outpatient Medications:    benzonatate (TESSALON) 100 MG capsule, Take 1-2 capsules (100-200 mg total) by mouth 2 (two) times daily as needed for cough., Disp: 30 capsule, Rfl: 0   dicyclomine (BENTYL) 20 MG tablet, Take 1 tablet (20 mg total) by mouth 3 (three) times daily as needed for up to 10 days for  spasms (muscle cramps/spasms)., Disp: 30 tablet, Rfl: 0   fluticasone (FLONASE) 50 MCG/ACT nasal spray, Place 1 spray into both nostrils daily., Disp: 16 g, Rfl: 2   ibuprofen (ADVIL) 600 MG tablet, Take 1 tablet (600 mg total) by mouth every 8 (eight) hours as needed., Disp: 30 tablet, Rfl: 0   levocetirizine (XYZAL) 5 MG tablet, Take 1 tablet (5 mg total) by mouth every evening., Disp: 30 tablet, Rfl: 0   methocarbamol (ROBAXIN) 500 MG tablet, Take 1 tablet (500 mg total) by mouth every 8 (eight) hours as needed for muscle spasms., Disp: 30 tablet, Rfl: 0   naproxen (NAPROSYN) 500 MG tablet, Take 1 tablet (500 mg total) by mouth 2 (two) times daily with a meal., Disp: 20 tablet, Rfl: 0   pantoprazole (PROTONIX) 40 MG tablet, Take 1 tablet (40 mg total) by mouth daily., Disp: 30 tablet, Rfl: 1   tamsulosin (FLOMAX) 0.4 MG CAPS capsule, Take 1 capsule (0.4 mg total) by mouth daily., Disp: 7 capsule, Rfl: 0  Observations/Objective: Patient is well-developed, well-nourished in no acute distress.  Resting comfortably  at home.  Head is normocephalic, atraumatic.  No labored breathing.  Speech is clear and coherent with logical content.  Patient is alert and oriented at baseline.    Assessment and Plan: 1. Constipation, unspecified constipation type Push fluids & bland foods   2. Gastroesophageal reflux disease without esophagitis  - famotidine (PEPCID) 20 MG tablet; Take 1 tablet (20 mg total) by mouth 2 (two) times daily. Take twice daily for one week then use as needed up to twice daily  Dispense: 60 tablet; Refill: 0 - omeprazole (PRILOSEC) 20 MG capsule; Take 1 capsule (20 mg total) by mouth daily.  Dispense: 30 capsule; Refill: 0     Follow Up Instructions: I discussed the assessment and treatment plan with the patient. The patient was provided an opportunity to ask questions and all were answered. The patient agreed with the plan and demonstrated an understanding of the instructions.   A copy of instructions were sent to the patient via MyChart unless otherwise noted below.    The patient was advised to call back or seek an in-person evaluation if the symptoms worsen or if the condition fails to improve as anticipated.  Time:  I spent 11 minutes with the patient via telehealth technology discussing the above problems/concerns.    Timothy Simas, FNP

## 2023-10-24 ENCOUNTER — Telehealth: Payer: Self-pay | Admitting: Physician Assistant

## 2023-10-24 DIAGNOSIS — S81811A Laceration without foreign body, right lower leg, initial encounter: Secondary | ICD-10-CM

## 2023-10-24 MED ORDER — CEPHALEXIN 500 MG PO CAPS: 500.0000 mg | ORAL_CAPSULE | Freq: Four times a day (QID) | ORAL | 0 refills | Status: DC

## 2023-10-24 NOTE — Progress Notes (Signed)
E-Visit for Simple Cut/Laceration  We are sorry that you have had an injury. Here is how we plan to help!  Based on what you shared with me it looks like you have a simple laceration that does not need to be repaired with stitches or tissue glue.   I have prescribed Keflex 500mg  Take 1 capsule 4 times daily for 5 days.   HOME CARE: Clean the cut or scrape - Wash it well with soap and water. * avoid using hydrogen peroxide which may cause tissue damage, or impede wound healing.  Stop the bleeding - If your cut or scrape is bleeding, press a clean cloth or bandage firmly on the area for 20 minutes. You can also help slow the bleeding by holding the cut above the level of your heart.   Put a thin layer of Bacitracin antibiotic ointment on the cut or scrape. (this can be purchased at any local pharmacy- ask your pharmacist if you need assistance)   Cover the cut or scrape with a bandage or gauze. Keep the bandage clean and dry. Change the bandage 1 to 2 times every day until your cut or scrape heals.   Watch for signs that your cut or scrape is infected (redness, drainage, pain, warmth, swelling or fever)  Over the next 48 hours your wound should start to improve with less pain, less swelling and less redness. If you should develop increasing pain, swelling, redness, fever, pus from the wound you should be seen immediately to make sure this is not becoming infected.   WOUND CARE: Please keep a layer of antibiotic ointment (bacitracin preferred) on this wound at least twice a day for the next seven days and keep a sterile dressing over top of it. You may gently clean the wound with warm soap and water between dressing changes.  We strongly recommend that you have a medical provider reevaluate your wound within 2 to 3 days in person to make sure that it is healing appropriately.  Thank you for choosing an e-visit.  Your e-visit answers were reviewed by a board certified advanced clinical  practitioner to complete your personal care plan. Depending upon the condition, your plan could have included both over the counter or prescription medications.  Please review your pharmacy choice. Make sure the pharmacy is open so you can pick up prescription now. If there is a problem, you may contact your provider through [employer] and have the prescription routed to another pharmacy.  Your safety is important to [place]. If you have drug allergies check your prescription carefully.   For the next 24 hours you can use MyChart to ask questions about today's visit, request a non-urgent call back, or ask for a work or school excuse. You will get an email in the next two days asking about your experience. I hope that your e-visit has been valuable and will speed your recovery.   I have spent 5 minutes in review of e-visit questionnaire, review and updating patient chart, medical decision making and response to patient.   Margaretann Loveless, PA-C

## 2023-11-05 ENCOUNTER — Encounter: Payer: Self-pay | Admitting: Emergency Medicine

## 2023-11-05 ENCOUNTER — Ambulatory Visit
Admission: EM | Admit: 2023-11-05 | Discharge: 2023-11-05 | Disposition: A | Discharge status: Home / Self Care | Payer: Self-pay | Attending: Family Medicine | Admitting: Family Medicine

## 2023-11-05 ENCOUNTER — Ambulatory Visit
Admission: RE | Admit: 2023-11-05 | Discharge: 2023-11-05 | Disposition: A | Discharge status: Home / Self Care | Payer: Self-pay | Source: Ambulatory Visit | Attending: Family Medicine | Admitting: Family Medicine

## 2023-11-05 DIAGNOSIS — R9389 Abnormal findings on diagnostic imaging of other specified body structures: Secondary | ICD-10-CM | POA: Insufficient documentation

## 2023-11-05 DIAGNOSIS — N50812 Left testicular pain: Secondary | ICD-10-CM

## 2023-11-05 DIAGNOSIS — N5089 Other specified disorders of the male genital organs: Secondary | ICD-10-CM | POA: Insufficient documentation

## 2023-11-05 DIAGNOSIS — N50819 Testicular pain, unspecified: Secondary | ICD-10-CM

## 2023-11-05 LAB — URINALYSIS, W/ REFLEX TO CULTURE (INFECTION SUSPECTED)
Bacteria, UA: NONE SEEN
Bilirubin Urine: NEGATIVE
Glucose, UA: NEGATIVE mg/dL
Hgb urine dipstick: NEGATIVE
Ketones, ur: NEGATIVE mg/dL
Leukocytes,Ua: NEGATIVE
Nitrite: NEGATIVE
Protein, ur: NEGATIVE mg/dL
Specific Gravity, Urine: 1.02 (ref 1.005–1.030)
pH: 7.5 (ref 5.0–8.0)

## 2023-11-05 MED ORDER — DOXYCYCLINE HYCLATE 100 MG PO CAPS: 100.0000 mg | ORAL_CAPSULE | Freq: Two times a day (BID) | ORAL | 0 refills | Status: AC

## 2023-11-05 NOTE — Discharge Instructions (Addendum)
Go to the medical mall to have an ultrasound performed. Look for signs at [facility] for the [facility]. Enter through the medical mall and go to the radiology department for your ultrasound.

## 2023-11-05 NOTE — ED Provider Notes (Signed)
 Timothy James    CSN: [id number] Arrival date & time: 11/05/23  0831      History   Chief Complaint Chief Complaint  Patient presents with   Groin Pain    left    HPI  HPI  Timothy James is a 28 y.o. male left sided testicle pain that radiates to his left hip for the past 3 days.  Denies dysuria, fever. Pain gets worse with activity. Has some slight left sided swelling.  Has history of kidney stones but hasn't had a stone in about 5 months.    He is sexually active. Denies known STI exposure.  Deklen does not use condoms regularly.    - Penile discharge: no  -Testicular pain: left  - Fever: no - Abdominal pain no - Rash: no - Arthralgias: not new - Nausea: no - Vomiting: no - Dysuria: no - Back Pain: lower back that is bilateral  - Headache: no       Past Medical History:  Diagnosis Date   Renal disorder     Patient Active Problem List   Diagnosis Date Noted   Anxiety 02/28/2013   Palpitations 02/28/2013    Past Surgical History:  Procedure Laterality Date   TONSILLECTOMY         Home Medications    Prior to Admission medications   Medication Sig Start Date End Date Taking? Authorizing Provider  doxycycline  (VIBRAMYCIN ) 100 MG capsule Take 1 capsule (100 mg total) by mouth 2 (two) times daily. 11/05/23  Yes Adante Courington, DO  dicyclomine  (BENTYL ) 20 MG tablet Take 1 tablet (20 mg total) by mouth 3 (three) times daily as needed for up to 10 days for spasms (muscle cramps/spasms). 09/14/22 09/24/22  Angelena Smalls, MD  fluticasone  (FLONASE ) 50 MCG/ACT nasal spray Place 1 spray into both nostrils daily. 11/07/22   Fleming, Zelda W, NP  ibuprofen  (ADVIL ) 600 MG tablet Take 1 tablet (600 mg total) by mouth every 8 (eight) hours as needed. 11/07/22   Fleming, Zelda W, NP  levocetirizine (XYZAL ) 5 MG tablet Take 1 tablet (5 mg total) by mouth every evening. 02/11/22   Merilee Andrea CROME, NP  methocarbamol  (ROBAXIN ) 500 MG tablet Take 1 tablet  (500 mg total) by mouth every 8 (eight) hours as needed for muscle spasms. 11/07/22   Fleming, Zelda W, NP  tamsulosin  (FLOMAX ) 0.4 MG CAPS capsule Take 1 capsule (0.4 mg total) by mouth daily. 09/02/22   Woods, Jaclyn M, PA-C    Family History Family History  Problem Relation Age of Onset   Hypertension Mother    Diabetes Father    Other Father 14       traumatic brain injury    Social History Social History   Tobacco Use   Smoking status: Never   Smokeless tobacco: Never  Vaping Use   Vaping status: Never Used  Substance Use Topics   Alcohol use: Not Currently   Drug use: Not Currently     Allergies   Patient has no known allergies.   Review of Systems Review of Systems: negative unless otherwise stated in HPI.      Physical Exam Triage Vital Signs ED Triage Vitals  Encounter Vitals Group     BP 11/05/23 0925 (!) 148/78     Systolic BP Percentile --      Diastolic BP Percentile --      Pulse Rate 11/05/23 0925 (!) 110     Resp 11/05/23 0925 15  Temp 11/05/23 0925 98.7 F (37.1 C)     Temp Source 11/05/23 0925 Oral     SpO2 11/05/23 0925 97 %     Weight 11/05/23 0923 141 lb 5 oz (64.1 kg)     Height 11/05/23 0923 6' 1 (1.854 m)     Head Circumference --      Peak Flow --      Pain Score 11/05/23 0923 5     Pain Loc --      Pain Education --      Exclude from Growth Chart --    No data found.  Updated Vital Signs BP (!) 148/78 (BP Location: Right Arm)   Pulse (!) 110   Temp 98.7 F (37.1 C) (Oral)   Resp 15   Ht 6' 1 (1.854 m)   Wt 64.1 kg   SpO2 97%   BMI 18.64 kg/m   Visual Acuity Right Eye Distance:   Left Eye Distance:   Bilateral Distance:    Right Eye Near:   Left Eye Near:    Bilateral Near:     Physical Exam GEN: well appearing male in no acute distress  CVS: well perfused  RESP: speaking in full sentences without pause, no respiratory distress  ABD: soft, suprapubic tenderness, no palpable masses, no CVA tenderness  GU:   normal penile shaft, no urethral discharge, left testicle tenderness without edema. Left epididymal tenderness; chaperoned by CMA Brittany  UC Treatments / Results  Labs (all labs ordered are listed, but only abnormal results are displayed) Labs Reviewed  URINALYSIS, W/ REFLEX TO CULTURE (INFECTION SUSPECTED)    EKG   Radiology US  SCROTUM W/DOPPLER Result Date: 11/05/2023 CLINICAL DATA:  2 day history of left cystic lower pain. EXAM: SCROTAL ULTRASOUND DOPPLER ULTRASOUND OF THE TESTICLES TECHNIQUE: Complete ultrasound examination of the testicles, epididymis, and other scrotal structures was performed. Color and spectral Doppler ultrasound were also utilized to evaluate blood flow to the testicles. COMPARISON:  None Available. FINDINGS: Right testicle Measurements: 4.3 x 2.0 x 2.3 cm. Multiple scattered tiny microliths evident. No intratesticular mass lesion. Left testicle Measurements: 4.3 x 2.1 x 2.5 cm. Multiple scattered tiny microliths. No intratesticular mass lesion Right epididymis:  2 mm epididymal cyst or spermatocele. Left epididymis:  Unremarkable. Hydrocele:  None visualized. Varicocele:  None visualized. Pulsed Doppler interrogation of both testes demonstrates normal low resistance arterial and venous waveforms bilaterally. IMPRESSION: 1. No evidence for testicular torsion or intratesticular mass lesion. 2. 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 Current literature suggests that testicular microlithiasis is not a significant independent risk factor for development of testicular carcinoma, and that follow up imaging is not warranted in the absence of other risk factors. Monthly testicular self-examination and annual physical exams are considered appropriate surveillance. If patient has other risk factors for testicular carcinoma, then referral to Urology should be considered. (Reference: DeCastro, et al.: A 5-Year Follow up Study of Asymptomatic Men with Testicular  Microlithiasis. J Urol 2008; 179:1420-1423.) 3. 2 mm right epididymal cyst or spermatocele. Electronically Signed   By: Camellia Candle M.D.   On: 11/05/2023 13:02    Procedures Procedures (including critical James time)  Medications Ordered in UC Medications - No data to display  Initial Impression / Assessment and Plan / UC Course  I have reviewed the triage vital signs and the nursing notes.  Pertinent labs & imaging results that were available during my James of the patient were reviewed by me and considered in my medical  decision making (see chart for details).     Pt is a 28 y.o. year old male who presents for testicular pain that started 3 days ago.  Overall, patient is well-appearing and in no acute distress.  Differential diagnosis includes testicular torsion, testicular mass/cancer, epididymitis, STI, varicocele, hydrocele, scrotal trauma, viral orchitis, inguinal hernia  UA not concerning for acute cystitis.  No hematuria seen. On exam, he has left testicular tenderness near the epididymis which feels edematous. GC and chlamydia not collected as he got his urine sample prior to collect and test would be inadequate.  Given unilateral pain, ordered STAT scrotal US  with Doppler. Pt to travel to [facility] for his ultrasound.  Treat presumed epididymitis with doxycycline  for now.    Return precautions including abdominal pain, fever, chills, nausea, or vomiting given. ED precautions given and understanding voiced. He may need to see a urologist.   Radiologist impression reviewed and patient called and updated with results. Referral to urology placed.    Final Clinical Impressions(s) / UC Diagnoses   Final diagnoses:  Testicular pain, left  Testicle tenderness  Testicular microlithiasis     Discharge Instructions      Go to the medical mall to have an ultrasound performed. Look for signs at [facility] for the [facility]. Enter through the medical  mall and go to the radiology department for your ultrasound.       ED Prescriptions     Medication Sig Dispense Auth. Provider   doxycycline  (VIBRAMYCIN ) 100 MG capsule Take 1 capsule (100 mg total) by mouth 2 (two) times daily. 20 capsule Kailah Pennel, DO      PDMP not reviewed this encounter.   Terrea Bruster, DO 11/05/23 1321

## 2023-11-05 NOTE — ED Triage Notes (Signed)
 Patient reports left sided groin that started 2 days ago.  Patient unsure of any swelling.  Patient dines any pain or difficulty urinating.  Patient denies any injury to the area.

## 2023-11-15 ENCOUNTER — Other Ambulatory Visit
Admission: RE | Admit: 2023-11-15 | Discharge: 2023-11-15 | Disposition: A | Discharge status: Home / Self Care | Payer: Self-pay | Attending: Urology | Admitting: Urology

## 2023-11-15 ENCOUNTER — Encounter: Payer: Self-pay | Admitting: Urology

## 2023-11-15 ENCOUNTER — Ambulatory Visit (INDEPENDENT_AMBULATORY_CARE_PROVIDER_SITE_OTHER): Payer: Self-pay | Admitting: Urology

## 2023-11-15 VITALS — BP 137/82 | Ht 73.0 in | Wt 145.0 lb

## 2023-11-15 DIAGNOSIS — Z87442 Personal history of urinary calculi: Secondary | ICD-10-CM

## 2023-11-15 DIAGNOSIS — N50819 Testicular pain, unspecified: Secondary | ICD-10-CM

## 2023-11-15 DIAGNOSIS — N200 Calculus of kidney: Secondary | ICD-10-CM

## 2023-11-15 LAB — URINALYSIS, COMPLETE (UACMP) WITH MICROSCOPIC
Bacteria, UA: NONE SEEN
Bilirubin Urine: NEGATIVE
Glucose, UA: NEGATIVE mg/dL
Hgb urine dipstick: NEGATIVE
Ketones, ur: NEGATIVE mg/dL
Leukocytes,Ua: NEGATIVE
Nitrite: NEGATIVE
Protein, ur: NEGATIVE mg/dL
RBC / HPF: NONE SEEN RBC/hpf (ref 0–5)
Specific Gravity, Urine: 1.01 (ref 1.005–1.030)
Squamous Epithelial / HPF: NONE SEEN /[HPF] (ref 0–5)
WBC, UA: NONE SEEN WBC/hpf (ref 0–5)
pH: 7 (ref 5.0–8.0)

## 2023-11-15 NOTE — Patient Instructions (Signed)
Testicular Self-Exam A testicular self-exam means looking at and feeling your testicles for unusual lumps or swelling. Swelling, lumps, or pain can be caused by: Injuries. Irritation and swelling (inflammation). Infection. Extra fluids around the testicle (hydrocele). Twisted testicles (testicular torsion). Cancer of the testicle (testicular cancer). You may be at risk for this if you have: A testicle that has not descended. Previously had cancer of the testicle. A family history of cancer of the testicle. General tips It is easiest to do a self-exam during or after a warm bath or shower. Testicles are harder to examine when you are cold. A normal testicle is egg-shaped and feels firm. It is smooth, and it is not tender. It is normal to feel a firm cord that feels like spaghetti at the back of your testicles. This is called the spermatic cord. Do a self-exam once a month. How to do a self-exam of testicles  Stand and hold your penis away from your body. Look at each testicle to check for changes in how it looks. Look for swelling or changes in size or shape. Roll each testicle between your thumb and finger. Feel the whole testicle. Feel for: Lumps. Swelling. Discomfort. Check for swelling or tender bumps in the groin area. Your groin is where your lower belly (abdomen) meets your upper thighs. Contact a doctor if: You find a bump or lump. This may be a small, hard bump that is the size of a pea. You have swelling, pain, or soreness in your testicle area. You see or feel any other changes in your testicles. This information is not intended to replace advice given to you by your health care provider. Make sure you discuss any questions you have with your health care provider. Document Revised: 06/28/2022 Document Reviewed: 06/28/2022 Elsevier Patient Education  2024 [employer].

## 2023-11-15 NOTE — Progress Notes (Signed)
   11/15/23 2:26 PM   Timothy James Syliva Overman 02-Jul-1996 [id number]  CC: Scrotal pain, history of kidney stones  HPI: 28 year old male with history of recurrent stones including 5 in the last 4 years, no prior surgeries, who is here today for follow-up after recent ER visit for possible epididymitis.  He was having 3 days of left-sided testicular pain, urinalysis was benign and he was treated with doxycycline.  He reports his symptoms have completely resolved.  He denies any complaints today and urinalysis is benign.   PMH: Past Medical History:  Diagnosis Date   Renal disorder     Surgical History: Past Surgical History:  Procedure Laterality Date   TONSILLECTOMY      Family History: Family History  Problem Relation Age of Onset   Hypertension Mother    Diabetes Father    Other Father 74       traumatic brain injury    Social History:  reports that he has never smoked. He has never used smokeless tobacco. He reports that he does not currently use alcohol. He reports that he does not currently use drugs.  Physical Exam: BP 137/82   Ht 6\' 1"  (1.854 m)   Wt 145 lb (65.8 kg)   BMI 19.13 kg/m    Constitutional:  Alert and oriented, No acute distress. Cardiovascular: No clubbing, cyanosis, or edema. Respiratory: Normal respiratory effort, no increased work of breathing. GI: Abdomen is soft, nontender, nondistended, no abdominal masses   Pertinent Imaging: I have personally viewed and interpreted the scrotal ultrasound showing no abnormalities.  Assessment & Plan:   28 year old male with recent episode of likely epididymitis treated with doxycycline with resolution of symptoms.  Reassurance provided regarding microlithiasis incidentally seen on ultrasound as well as benign epididymal cyst. We discussed general stone prevention strategies including adequate hydration with goal of producing 2.5 L of urine daily, increasing citric acid intake, increasing calcium intake during  high oxalate meals, minimizing animal protein, and decreasing salt intake. Information about dietary recommendations given today.   Consider 24-hour urine metabolic workup if recurrent stones, follow-up with urology as needed   Legrand Rams, MD 11/15/2023  [facility]: [facility] [address] [place], [place] [address] [phone]

## 2024-02-15 ENCOUNTER — Emergency Department
Admission: EM | Admit: 2024-02-15 | Discharge: 2024-02-15 | Disposition: A | Discharge status: Home / Self Care | Payer: Self-pay | Attending: Emergency Medicine | Admitting: Emergency Medicine

## 2024-02-15 ENCOUNTER — Other Ambulatory Visit: Payer: Self-pay

## 2024-02-15 ENCOUNTER — Telehealth: Payer: Self-pay | Admitting: Family Medicine

## 2024-02-15 DIAGNOSIS — R195 Other fecal abnormalities: Secondary | ICD-10-CM

## 2024-02-15 DIAGNOSIS — R194 Change in bowel habit: Secondary | ICD-10-CM | POA: Insufficient documentation

## 2024-02-15 HISTORY — DX: Anxiety disorder, unspecified: F41.9

## 2024-02-15 NOTE — ED Triage Notes (Signed)
 Pt to ED for "narrow stools" (wider than pencil) that is "flaky" and "floaty" in toilet since 2 weeks ago. States no abdominal pain, no blood in stool. Feels constipated today with rectal fullness. LBM 1 hour PTA but only 2 small pieces. States feels rectal burning to outside while having BM.  States has been on mostly fruit diet since 1 month.

## 2024-02-15 NOTE — ED Notes (Signed)
 Urine sample sent down to lab.

## 2024-02-15 NOTE — Progress Notes (Signed)
 E-Visit for Diarrhea  We are sorry that you are not feeling well.  Here is how we plan to help!  You changed your diet which might be causing you to have faster transit with increased fiber. It should adjust as you continue to eat healthier. If you do not change, continue with diarrhea, develop blood or pain- you need to be seen in person.   For your symptoms you may take Imodium 2 mg tablets that are over the counter at your local pharmacy. Take two tablet now and then one after each loose stool up to 6 a day. If you find that you are having a lot of watery stools to help slow it down some. This can cause constipation so be cautious with use.   Antibiotics are not needed for most people with diarrhea.     HOME CARE We recommend changing your diet to help with your symptoms for the next few days. Drink plenty of fluids that contain water salt and sugar. Sports drinks such as Gatorade may help.  You may try broths, soups, bananas, applesauce, soft breads, mashed potatoes or crackers.  You are considered infectious for as long as the diarrhea continues. Hand washing or use of alcohol based hand sanitizers is recommend. It is best to stay out of work or school until your symptoms stop.   GET HELP RIGHT AWAY If you have dark yellow colored urine or do not pass urine frequently you should drink more fluids.   If your symptoms worsen  If you feel like you are going to pass out (faint) You have a new problem  MAKE SURE YOU  Understand these instructions. Will watch your condition. Will get help right away if you are not doing well or get worse.  Thank you for choosing an e-visit.  Your e-visit answers were reviewed by a board certified advanced clinical practitioner to complete your personal care plan. Depending upon the condition, your plan could have included both over the counter or prescription medications.  Please review your pharmacy choice. Make sure the pharmacy is open so you  can pick up prescription now. If there is a problem, you may contact your provider through [employer] and have the prescription routed to another pharmacy.  Your safety is important to us . If you have drug allergies check your prescription carefully.   For the next 24 hours you can use MyChart to ask questions about today's visit, request a non-urgent call back, or ask for a work or school excuse. You will get an email in the next two days asking about your experience. I hope that your e-visit has been valuable and will speed your recovery.  I provided 5 minutes of non face-to-face time during this encounter for chart review, medication and order placement, as well as and documentation.

## 2024-02-15 NOTE — Discharge Instructions (Addendum)
 You have been diagnosed with change in stool caliber, possible IBS secondary to change in diet.  Please decrease the consumption of fruit, please take fluids with electrolytes like liquid IV.  Continue doing exercise.  You can call Dr. Antony Baumgartner and make an appointment for a follow-up if your symptoms persist after recent your consumption of fruit.  Please come back to ED or go to your PCP if you have new symptoms or symptoms worsen

## 2024-02-15 NOTE — ED Provider Notes (Signed)
 [facility] Provider Note    Event Date/Time   First MD Initiated Contact with Patient 02/15/24 1704     (approximate)   History   Constipation    HPI  Timothy James is a 28 y.o. 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 According to the patient, he changing his diet in the last 3 months, he is eating mostly fruits and protein.  No vegetables.  Patient describes having stools 3 times per day, with a small caliber.  Patient is concerned about colon cancer after reading the description of the stools from colon cancer in [employer].  Patient consulted urgent care via Zoom who recommended him to come to ED for a physical exam.  Patient denies history of colon cancer in his family.  Patient denies unintentional weight loss, denies blood in his stools, denies abdominal pain.  Patient states he has anxiety.  Patient is trying to improve his diet and doing more exercise.  Patient has a picture of his stools on his cell phone and the stool caliber is small.       Physical Exam   Triage Vital Signs: ED Triage Vitals  Encounter Vitals Group     BP 02/15/24 1551 (!) 153/90     Systolic BP Percentile --      Diastolic BP Percentile --      Pulse Rate 02/15/24 1551 (!) 107     Resp 02/15/24 1551 20     Temp 02/15/24 1551 97.9 F (36.6 C)     Temp Source 02/15/24 1551 Oral     SpO2 02/15/24 1551 99 %     Weight 02/15/24 1551 163 lb (73.9 kg)     Height 02/15/24 1551 6\' 1"  (1.854 m)     Head Circumference --      Peak Flow --      Pain Score 02/15/24 1552 0     Pain Loc --      Pain Education --      Exclude from Growth Chart --     Most recent vital signs: Vitals:   02/15/24 1551  BP: (!) 153/90  Pulse: (!) 107  Resp: 20  Temp: 97.9 F (36.6 C)  SpO2: 99%     Constitutional: Alert, NAD. Able  to speak in complete sentences without cough or dyspnea  Eyes: Conjunctivae are normal.  Head: Atraumatic. Nose: No congestion/rhinnorhea. Mouth/Throat: Mucous membranes are moist.   Neck: Painless ROM. Supple. No JVD, nodes, thyromegaly  Cardiovascular:   Good peripheral circulation.RRR no murmurs, gallops, rubs  Respiratory: Normal respiratory effort.  No retractions. Clear to auscultation bilaterally without wheezing or crackles  Gastrointestinal: Skin is intact, no scars no ecchymosis, bowel sounds positive soft and nontender.  Tympanic due to gas. Rectal exam: No presence of external hemorrhoids, no presence of blood.  Presence of some stool Musculoskeletal:  no deformity Neurologic:  MAE spontaneously. No gross focal neurologic deficits are appreciated.  Skin:  Skin is warm, dry and intact. No rash noted. Psychiatric: Mood and affect are normal. Speech and behavior are normal.    ED Results / Procedures / Treatments   Labs (all labs ordered are listed, but only abnormal results are displayed) Labs Reviewed - No data to display   EKG    RADIOLOGY    PROCEDURES:  Critical  Care performed:   Procedures   MEDICATIONS ORDERED IN ED: Medications - No data to display    IMPRESSION / MDM / ASSESSMENT AND PLAN / ED COURSE  I reviewed the triage vital signs and the nursing notes.  Differential diagnosis includes, but is not limited to, diet change, IBS, hemorrhoids, foreign body  Patient's presentation is most consistent with acute, uncomplicated illness.   Patient's diagnosis is consistent with change in his stools due to change in diet, and possible IBS. According with the description of the stools and after looking on a picture on his cell phone, patient is not having any constipation.  Patient is having 3 stools per day soft like ice cream.  Patient was concerned about colon cancer, he denies family history I reassured him the low risk factor for colon cancer at his  age.  I recommended the patient to decrease the consumption of fruit, increase taking fluids with electrolytes.Continue doing exercise to decrease anxiety. I did review the patient's allergies and medications.The patient is in stable and satisfactory condition for discharge home  Patient will be discharged home without prescriptions. Patient is to follow up with GI as needed or otherwise directed. Patient is given ED precautions to return to the ED for any worsening or new symptoms. Discussed plan of care with patient, answered all of patient's questions, Patient agreeable to plan of care. Patient verbalized understanding.    FINAL CLINICAL IMPRESSION(S) / ED DIAGNOSES   Final diagnoses:  Change in stool caliber     Rx / DC Orders   ED Discharge Orders     None        Note:  This document was prepared using Dragon voice recognition software and may include unintentional dictation errors.   Awilda Lennox, PA-C 02/15/24 1849    Kandee Orion, MD 02/15/24 2139

## 2024-02-17 ENCOUNTER — Telehealth: Payer: Self-pay

## 2024-02-17 NOTE — Telephone Encounter (Addendum)
 The patient called to schedule an appointment at our office. I informed him that we currently do not have any available appointments for new patients. I offered to add him to our call-back list and assured him that we will contact him as soon as an appointment becomes available.

## 2024-06-21 ENCOUNTER — Other Ambulatory Visit: Payer: Self-pay

## 2024-06-21 ENCOUNTER — Emergency Department
Admission: EM | Admit: 2024-06-21 | Discharge: 2024-06-21 | Disposition: A | Discharge status: Home / Self Care | Payer: Self-pay | Attending: Emergency Medicine | Admitting: Emergency Medicine

## 2024-06-21 DIAGNOSIS — G44209 Tension-type headache, unspecified, not intractable: Secondary | ICD-10-CM | POA: Insufficient documentation

## 2024-06-21 NOTE — ED Triage Notes (Signed)
 Pt to ED for 2 knots on head, report had headache a couple days ago. Denies injuries

## 2024-06-21 NOTE — Discharge Instructions (Addendum)
 Your evaluated in the ED for headaches and concerns for not on the top of your head.  Your physical exam findings were reassuring of any acute or life-threatening injuries or illnesses.  As discussed, there did not appear to be a noticeable knot on your scalp versus your normal anatomical variant of your skull.  Please monitor closely this area.  If any rapid growth, tenderness or change in size, shape or color for please follow-up with your primary care provider.  For headache treatment-  pain control:  Ibuprofen  (motrin /aleve /advil ) - You can take 3 tablets (600 mg) every 6 hours as needed for pain/fever.  Acetaminophen  (tylenol ) - You can take 2 extra strength tablets (1000 mg) every 6 hours as needed for pain/fever.  You can alternate these medications or take them together.  Make sure you eat food/drink water when taking these medications.

## 2024-06-21 NOTE — ED Provider Notes (Signed)
 [facility] Emergency Department Provider Note     Event Date/Time   First MD Initiated Contact with Patient 06/21/24 1508     (approximate)   History   Headache   HPI  Timothy James is a 28 y.o. male with a past medical history of anxiety presents to the ED for evaluation of a tension headache and concerns for possible 2 non painful knots noticed on his frontal scalp over the 2-3 days.  Patient characterizes headache as a bandlike pressure headache. Patient denies vision changes, nausea or vomiting or fever. Endorses his son being sick at home and having similar symptoms. He reports taking an at home COVID test that was negative. Has taking tylenol  with relief.      Physical Exam   Triage Vital Signs: ED Triage Vitals  Encounter Vitals Group     BP 06/21/24 1343 129/88     Girls Systolic BP Percentile --      Girls Diastolic BP Percentile --      Boys Systolic BP Percentile --      Boys Diastolic BP Percentile --      Pulse Rate 06/21/24 1343 86     Resp 06/21/24 1343 16     Temp 06/21/24 1343 98.4 F (36.9 C)     Temp src --      SpO2 06/21/24 1343 97 %     Weight 06/21/24 1345 160 lb (72.6 kg)     Height 06/21/24 1345 6' (1.829 m)     Head Circumference --      Peak Flow --      Pain Score 06/21/24 1345 0     Pain Loc --      Pain Education --      Exclude from Growth Chart --     Most recent vital signs: Vitals:   06/21/24 1343  BP: 129/88  Pulse: 86  Resp: 16  Temp: 98.4 F (36.9 C)  SpO2: 97%   General: Well appearing and comfortable. Alert and oriented. INAD.     Head:  NCAT. No noticeable knots to area of concern. Appears to be normal skull contour. No fluctuant or tender mass to palpate.  Eyes:  PERRLA. EOMI.  Neck:   No cervical spine tenderness to palpation. Full ROM without difficulty.  CV:  Good peripheral perfusion.  RESP:  Normal effort.  NEURO: Cranial nerves II-XII intact. No focal deficits. Speech clear.  Sensation and motor function intact. Normal muscle strength of UE & LE. Gait is steady.    ED Results / Procedures / Treatments   Labs (all labs ordered are listed, but only abnormal results are displayed) Labs Reviewed - No data to display  No results found.  PROCEDURES:  Critical Care performed: No  Procedures  MEDICATIONS ORDERED IN ED: Medications - No data to display  IMPRESSION / MDM / ASSESSMENT AND PLAN / ED COURSE  I reviewed the triage vital signs and the nursing notes.                               28 y.o. male presents to the emergency department for evaluation and treatment of headache and scalp knots . See HPI for further details.   Differential diagnosis includes, but is not limited to tension headache, migraine, lipoma, cyst, folliculitis  Patient's presentation is most consistent with acute, uncomplicated illness.  Patient is alert and oriented.  He  is hemodynamic stable.  Physical exam findings are stated above and no palpable knot was appreciated.  Normal neuroexam.  No indication for further workup with imaging.  Advised patient to closely monitor area for growth and changes.  Encouraged follow-up with primary care provider.  A referral has been sent. Offered migraine cocktail or IM Toradol  for headache symptoms.  Patient declined as he is fearful of needles.  I then offered oral medication and patient reports he will go home and take Tylenol .   Patient stable condition for discharge home.  ED return precaution discussed.  All questions and concerns were addressed during this ED visit.  FINAL CLINICAL IMPRESSION(S) / ED DIAGNOSES   Final diagnoses:  Tension headache   Rx / DC Orders   ED Discharge Orders          Ordered    Ambulatory Referral to Primary Care (Establish Care)        06/21/24 1617             Note:  This document was prepared using Dragon voice recognition software and may include unintentional dictation errors.     Margrette, Rocky Rishel A, PA-C 06/21/24 1619    Arlander Charleston, MD 06/21/24 [phone]

## 2024-07-11 ENCOUNTER — Ambulatory Visit: Payer: Self-pay | Admitting: Family Medicine

## 2024-07-23 ENCOUNTER — Ambulatory Visit: Payer: Self-pay | Admitting: Family Medicine

## 2024-08-01 ENCOUNTER — Ambulatory Visit: Payer: Self-pay | Admitting: Family Medicine

## 2024-11-21 ENCOUNTER — Ambulatory Visit: Payer: Self-pay | Admitting: Physician Assistant
